# Patient Record
Sex: Male | Born: 1945 | Race: White | Hispanic: No | Marital: Married | Smoking: Former smoker
Health system: Southern US, Community
[De-identification: ages and names within clinical notes are randomized; demographics above are authoritative.]

## PROBLEM LIST (undated history)

## (undated) DIAGNOSIS — F419 Anxiety disorder, unspecified: Secondary | ICD-10-CM

## (undated) DIAGNOSIS — G4730 Sleep apnea, unspecified: Secondary | ICD-10-CM

## (undated) DIAGNOSIS — I10 Essential (primary) hypertension: Secondary | ICD-10-CM

## (undated) DIAGNOSIS — E785 Hyperlipidemia, unspecified: Secondary | ICD-10-CM

## (undated) DIAGNOSIS — Z8719 Personal history of other diseases of the digestive system: Secondary | ICD-10-CM

## (undated) DIAGNOSIS — K219 Gastro-esophageal reflux disease without esophagitis: Secondary | ICD-10-CM

## (undated) DIAGNOSIS — E119 Type 2 diabetes mellitus without complications: Secondary | ICD-10-CM

## (undated) DIAGNOSIS — F329 Major depressive disorder, single episode, unspecified: Secondary | ICD-10-CM

## (undated) DIAGNOSIS — F32A Depression, unspecified: Secondary | ICD-10-CM

## (undated) DIAGNOSIS — M1990 Unspecified osteoarthritis, unspecified site: Secondary | ICD-10-CM

## (undated) HISTORY — PX: VASECTOMY: SHX75

## (undated) HISTORY — PX: TONSILLECTOMY: SUR1361

## (undated) HISTORY — PX: EYE SURGERY: SHX253

---

## 2013-05-15 ENCOUNTER — Other Ambulatory Visit: Payer: Self-pay | Admitting: Orthopedic Surgery

## 2013-05-15 NOTE — H&P (Signed)
Darryl Chang DOB: [birth year] Married / Language: English / Race: White Male  Date of Admission:  06-11-2013  Chief Complaint:  Bilateral Knee Pain  History of Present Illness The patient is a 68 year old male who comes in  for a preoperative History and Physical. The patient is scheduled for a bilateral total knee arthroplasty to be performed by Dr. Gus RankinFrank V. [name], MD at [facility] on 06-11-2013. The patient is a 68 year old male who presents with knee complaints. The patient is seen in referral from Dr Amanda PeaGramig. The patient reports left knee and right knee symptoms including: pain which began year(s) ago without any known injury. [facility]. He has had cortisone and visco injections in the past. He reports that the cortisone injections helped for several months at a time but his last injection was most likely 2 years ago. He did not have any relief with the visco. He has never had surgery on the knees. His left knee is bothering him more than the right knee. He is getting to the point where his activity is severely limited. He is semi-retired now. 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 He feels that he needs to have the knees replaced but is concerned that if he does he will not longer be able to do the work that he currently does. He states that both knees are hurting badly. 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 He said that he is doing more of a supervisory role now than actually getting down on his knees to do things. He said functionally the knees have gotten much worse. He is at a stage now where he feels as though is knees need to be fixed. Both knees are bothering him equally. He has had cortisone and viscosupplement injections without benefit. He is ready to proceed with bilateral knee replacements. They have been treated conservatively in the past for the above  stated problem and despite conservative measures, they continue to have progressive pain and severe functional limitations and dysfunction. They have failed non-operative management including home exercise, medications, and injections. It is felt that they would benefit from undergoing total joint replacement. Risks and benefits of the procedure have been discussed with the patient and they elect to proceed with surgery. There are no active contraindications to surgery such as ongoing infection or rapidly progressive neurological disease.   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 Mother.    Social History Marital status. married Never consumed alcohol. 02/21/2013: Never consumed alcohol Living situation. live with spouse Current work status. retired Exercise. Exercises rarely No history of drug/alcohol rehab Tobacco / smoke exposure. 02/21/2013: no Not under pain contract Number of flights of stairs before winded. less than 1 Children. 3 Tobacco use. Former smoker. 02/21/2013: smoke(d) 1 pack(s) per day Post-Surgical Plans. [facility] Advance Directives. Living Will, Healthcare POA    Medication History Anafranil (50MG  Capsule, Oral) Active. Aspirin EC (81MG  Tablet DR, Oral) Active. GlipiZIDE (5MG  Tablet, Oral) Active. Lisinopril (20MG  Tablet, Oral) Active. MetFORMIN HCl (1000MG  Tablet, Oral) Active. Naproxen (500MG  Tablet, Oral) Active. Omeprazole (20MG  Capsule DR, Oral) Active. PROzac (20MG   Capsule, Oral) Active. Simvastatin (20MG  Tablet, Oral) Active.    Past Surgical History Tonsillectomy Vasectomy Cataract  Surgery. bilateral   Review of Systems\ General:Not Present- Chills, Fever, Night Sweats, Fatigue, Weight Gain, Weight Loss and Memory Loss. Skin:Not Present- Hives, Itching, Rash, Eczema and Lesions. HEENT:Not Present- Tinnitus, Headache, Double Vision, Visual Loss, Hearing Loss and Dentures. Respiratory:Not Present- Shortness of breath with exertion, Shortness of breath at rest, Allergies, Coughing up blood and Chronic Cough. Cardiovascular:Not Present- Chest Pain, Racing/skipping heartbeats, Difficulty Breathing Lying Down, Murmur, Swelling and Palpitations. Gastrointestinal:Not Present- Bloody Stool, Heartburn, Abdominal Pain, Vomiting, Nausea, Constipation, Diarrhea, Difficulty Swallowing, Jaundice and Loss of appetitie. Male Genitourinary:Not Present- Urinary frequency, Blood in Urine, Weak urinary stream, Discharge, Flank Pain, Incontinence, Painful Urination, Urgency, Urinary Retention and Urinating at Night. Musculoskeletal:Not Present- Muscle Weakness, Muscle Pain, Joint Swelling, Joint Pain, Back Pain, Morning Stiffness and Spasms. Neurological:Not Present- Tremor, Dizziness, Blackout spells, Paralysis, Difficulty with balance and Weakness. Psychiatric:Not Present- Insomnia.    Vitals Pulse: 68 (Regular) Resp.: 14 (Unlabored) BP: 124/78 (Sitting, Right Arm, Standard)     Physical Exam The physical exam findings are as follows: The patient is a 68 year old male with continued bilateral knee pain. He is accompanied by his wife Diane.   General Mental Status - Alert, cooperative and good historian. General Appearance- pleasant. Not in acute distress. Orientation- Oriented X3. Build & Nutrition- Well nourished and Well developed.   Head and Neck Head- normocephalic, atraumatic . Neck Global Assessment- supple. no bruit auscultated on the right and no bruit auscultated on the left.   Eye Pupil- Bilateral- Regular and Round. Motion-  Bilateral- EOMI. wears glasses ENMT full upper and partial lower dentures  Chest and Lung Exam Auscultation: Breath sounds:- clear at anterior chest wall and - clear at posterior chest wall. Adventitious sounds:- No Adventitious sounds.   Cardiovascular Auscultation:Rhythm- Regular rate and rhythm. Heart Sounds- S1 WNL and S2 WNL. Murmurs & Other Heart Sounds:Auscultation of the heart reveals - No Murmurs.   Abdomen Inspection:Contour- Generalized mild distention. Palpation/Percussion:Tenderness- Abdomen is non-tender to palpation. Rigidity (guarding)- Abdomen is soft. Auscultation:Auscultation of the abdomen reveals - Bowel sounds normal.   Male Genitourinary  Not done, not pertinent to present illness  Musculoskeletal On exam, very pleasant, well developed male alert and oriented in no apparent distress. Both hips show normal range of motion with no discomfort. Both knees show no effusion. He has varus deformity on both knees. His range of motion is about 5-125 on each side with marked crepitus on range of motion. He has tenderness medial greater than lateral with no instability noted.  RADIOGRAPHS: AP both knees and lateral show severe bone on bone arthritis medial and patellofemoral compartments of both knees with significant varus deformity in both.   Assessment & Plan Osteoarthritis of both knees (715.26)  Note: Plan is for a Bilateral Total Knee Replacements by Dr. Lequita Halt.  Plan is to go to inpatient rehab facility. [facility] [address] [place], [place]  [phone] The patient will bring further contact information to the hospital.   The rehab facility is in [place] as the RX's will need to be signed by Dr. Lequita Halt prior to his discharge from the hospital  PCP - Dr. Carvel Getting  The patient does not have any contraindications and will receive TXA (tranexamic acid) prior to surgery.  Signed electronically by  Lauraine Rinne, III PA-C

## 2013-05-28 ENCOUNTER — Encounter (HOSPITAL_COMMUNITY): Payer: Self-pay | Admitting: Pharmacy Technician

## 2013-06-03 ENCOUNTER — Encounter (HOSPITAL_COMMUNITY)
Admission: RE | Admit: 2013-06-03 | Discharge: 2013-06-03 | Disposition: A | Discharge status: Home / Self Care | Payer: Medicare Other | Source: Ambulatory Visit | Attending: Orthopedic Surgery | Admitting: Orthopedic Surgery

## 2013-06-03 ENCOUNTER — Encounter (HOSPITAL_COMMUNITY): Payer: Self-pay

## 2013-06-03 ENCOUNTER — Encounter (INDEPENDENT_AMBULATORY_CARE_PROVIDER_SITE_OTHER): Payer: Self-pay

## 2013-06-03 DIAGNOSIS — Z01812 Encounter for preprocedural laboratory examination: Secondary | ICD-10-CM | POA: Insufficient documentation

## 2013-06-03 HISTORY — DX: Type 2 diabetes mellitus without complications: E11.9

## 2013-06-03 HISTORY — DX: Unspecified osteoarthritis, unspecified site: M19.90

## 2013-06-03 HISTORY — DX: Hyperlipidemia, unspecified: E78.5

## 2013-06-03 HISTORY — DX: Personal history of other diseases of the digestive system: Z87.19

## 2013-06-03 HISTORY — DX: Anxiety disorder, unspecified: F41.9

## 2013-06-03 HISTORY — DX: Major depressive disorder, single episode, unspecified: F32.9

## 2013-06-03 HISTORY — DX: Sleep apnea, unspecified: G47.30

## 2013-06-03 HISTORY — DX: Gastro-esophageal reflux disease without esophagitis: K21.9

## 2013-06-03 HISTORY — DX: Depression, unspecified: F32.A

## 2013-06-03 HISTORY — DX: Essential (primary) hypertension: I10

## 2013-06-03 LAB — SURGICAL PCR SCREEN
MRSA, PCR: NEGATIVE
Staphylococcus aureus: NEGATIVE

## 2013-06-03 LAB — PROTIME-INR
INR: 0.98 (ref 0.00–1.49)
Prothrombin Time: 12.8 seconds (ref 11.6–15.2)

## 2013-06-03 LAB — APTT: aPTT: 30 seconds (ref 24–37)

## 2013-06-03 LAB — ABO/RH: ABO/RH(D): O POS

## 2013-06-03 NOTE — Patient Instructions (Signed)
   Y[facility] SURGERY IS SCHEDULED AT [facility]  ON:  Wednesday  4/8  REPORT TO  SHORT STAY CENTER AT:  11:00 AM      PHONE # FOR SHORT STAY IS [phone]  DO NOT EAT ANYTHING AFTER MIDNIGHT THE NIGHT BEFORE YOUR SURGERY.  YOU MAY BRUSH YOUR TEETH.   NO FOOD, NO CHEWING GUM, NO MINTS, NO CANDIES, NO CHEWING TOBACCO. YOU MAY HAVE CLEAR LIQUIDS TO DRINK FROM MIDNIGHT UNTIL 7:30 AM DAY OF YOUR SURGERY - LIKE WATER, TEA.      NOTHING TO DRINK AFTER 7:30 AM DAY OF SURGERY   PLEASE TAKE THE FOLLOWING MEDICATIONS THE AM OF YOUR SURGERY WITH A FEW SIPS OF WATER:  FLUOXETINE, OMEPRAZOLE.   IF YOU ARE DIABETIC:  DO NOT TAKE ANY DIABETIC MEDICATIONS THE AM OF YOUR SURGERY.  IF YOU HAVE SLEEP APNEA AND USE CPAP OR BIPAP--PLEASE BRING THE MASK AND THE TUBING.  DO NOT BRING YOUR MACHINE.  DO NOT BRING VALUABLES, MONEY, CREDIT CARDS.  DO NOT WEAR JEWELRY, MAKE-UP, NAIL POLISH AND NO METAL PINS OR CLIPS IN YOUR HAIR. CONTACT LENS, DENTURES / PARTIALS, GLASSES SHOULD NOT BE WORN TO SURGERY AND IN MOST CASES-HEARING AIDS WILL NEED TO BE REMOVED.  BRING YOUR GLASSES CASE, ANY EQUIPMENT NEEDED FOR YOUR CONTACT LENS. FOR PATIENTS ADMITTED TO THE HOSPITAL--CHECK OUT TIME THE DAY OF DISCHARGE IS 11:00 AM.  ALL INPATIENT ROOMS ARE PRIVATE - WITH BATHROOM, TELEPHONE, TELEVISION AND WIFI INTERNET.                                                    PLEASE READ OVER ANY  FACT SHEETS THAT YOU WERE GIVEN: MRSA INFORMATION, BLOOD TRANSFUSION INFORMATION, INCENTIVE SPIROMETER INFORMATION.  FAILURE TO FOLLOW THESE INSTRUCTIONS MAY RESULT IN THE CANCELLATION OF YOUR SURGERY. PLEASE BE AWARE THAT YOU MAY NEED ADDITIONAL BLOOD DRAWN DAY OF YOUR SURGERY  PATIENT SIGNATURE_________________________________

## 2013-06-03 NOTE — Pre-Procedure Instructions (Signed)
PT'S MEDICAL DOCTOR - DR. SETTLE - SENT NOTE OF MEDICAL CLEARANCE FOR SURGERY AND PT'S EKG, CXR, CBC, CMET AND URINALYSIS REPORTS FROM 05/29/13. PT, PTT, T/S WERE DONE TODAY - [facility] AT [facility].

## 2013-06-05 ENCOUNTER — Other Ambulatory Visit: Payer: Self-pay | Admitting: Orthopedic Surgery

## 2013-06-05 NOTE — H&P (Signed)
Darryl Chang DOB: 07/23/1945 Married / Language: English / Race: White Male  Date of Admission:  06-11-2013  Chief Complaint:  Bilateral Knee Pain  History of Present Illness The patient is a 68 year old male who comes in for a preoperative History and Physical. The patient is scheduled for a bilateral total knee arthroplasty to be performed by Dr. [name]. [name], MD at [facility] on 06-11-2013. The patient is a 68 year old male who presents with knee complaints. The patient is seen in referral from Dr [name]. The patient reports left knee and right knee symptoms including: pain which began year(s) ago without any known injury. He has been [facility] by [facility]. He has had cortisone and visco injections in the past. He reports that the cortisone injections helped for several months at a time but his last injection was most likely 2 years ago. He did not have any relief with the visco. He has never had surgery on the knees. His left knee is bothering him more than the right knee. He is getting to the point where his activity is severely limited. 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 He feels that he needs to have the knees replaced but is concerned that if he does he will not longer be able to do the work that he currently does. He states that both knees are hurting badly. 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 He said that he is doing more of a supervisory role now than actually getting down on his knees to do things. He said functionally the knees have gotten much worse. He is at a stage now where he feels as though is knees need to be fixed. Both knees are bothering him equally. He has had cortisone and viscosupplement injections without benefit. He is ready to proceed with bilateral knee replacements. They have been treated conservatively in the past for the above stated  problem and despite conservative measures, they continue to have progressive pain and severe functional limitations and dysfunction. They have failed non-operative management including home exercise, medications, and injections. It is felt that they would benefit from undergoing total joint replacement. Risks and benefits of the procedure have been discussed with the patient and they elect to proceed with surgery. There are no active contraindications to surgery such as ongoing infection or rapidly progressive neurological disease.   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 Mother.    Social History Marital status. married Never consumed alcohol. 02/21/2013: Never consumed alcohol Living situation. live with spouse Current work status. retired Exercise. Exercises rarely No history of drug/alcohol rehab Tobacco / smoke exposure. 02/21/2013: no Not under pain contract Number of flights of stairs before winded. less than 1 Children. 3 Tobacco use. Former smoker. 02/21/2013: smoke(d) 1 pack(s) per day Post-Surgical Plans. Inpatient Rehab - [facility] Advance Directives. Living Will, Healthcare POA    Medication History Anafranil (50MG  Capsule, Oral) Active. Aspirin EC (81MG  Tablet DR, Oral) Active. GlipiZIDE (5MG  Tablet, Oral) Active. Lisinopril (20MG  Tablet, Oral) Active. MetFORMIN HCl (1000MG  Tablet, Oral) Active. Naproxen (500MG  Tablet, Oral) Active. Omeprazole (20MG  Capsule DR, Oral) Active. PROzac (20MG  Capsule,  Oral) Active. Simvastatin (20MG  Tablet, Oral) Active.    Past Surgical History Tonsillectomy Vasectomy Cataract Surgery.  bilateral    Review of Systems General:Not Present- Chills, Fever, Night Sweats, Fatigue, Weight Gain, Weight Loss and Memory Loss. Skin:Not Present- Hives, Itching, Rash, Eczema and Lesions. HEENT:Not Present- Tinnitus, Headache, Double Vision, Visual Loss, Hearing Loss and Dentures. Respiratory:Not Present- Shortness of breath with exertion, Shortness of breath at rest, Allergies, Coughing up blood and Chronic Cough. Cardiovascular:Not Present- Chest Pain, Racing/skipping heartbeats, Difficulty Breathing Lying Down, Murmur, Swelling and Palpitations. Gastrointestinal:Not Present- Bloody Stool, Heartburn, Abdominal Pain, Vomiting, Nausea, Constipation, Diarrhea, Difficulty Swallowing, Jaundice and Loss of appetitie. Male Genitourinary:Not Present- Urinary frequency, Blood in Urine, Weak urinary stream, Discharge, Flank Pain, Incontinence, Painful Urination, Urgency, Urinary Retention and Urinating at Night. Musculoskeletal:Not Present- Muscle Weakness, Muscle Pain, Joint Swelling, Joint Pain, Back Pain, Morning Stiffness and Spasms. Neurological:Not Present- Tremor, Dizziness, Blackout spells, Paralysis, Difficulty with balance and Weakness. Psychiatric:Not Present- Insomnia.    Vitals Pulse: 68 (Regular) Resp.: 14 (Unlabored) BP: 124/78 (Sitting, Right Arm, Standard)     Physical Exam([name], III PA-C; 05/15/2013 11:33 AM) The physical exam findings are as follows:   General Mental Status - Alert, cooperative and good historian. General Appearance- pleasant. Not in acute distress. Orientation- Oriented X3. Build & Nutrition- Well nourished and Well developed.   Head and Neck Head- normocephalic, atraumatic . Neck Global Assessment- supple. no bruit auscultated on the right and no bruit auscultated on the left.   Eye Pupil- Bilateral- Regular and Round. Motion- Bilateral- EOMI. wears glasses  ENMT : full upper and  partial lower dentures  Chest and Lung Exam Auscultation: Breath sounds:- clear at anterior chest wall and - clear at posterior chest wall. Adventitious sounds:- No Adventitious sounds.   Cardiovascular Auscultation:Rhythm- Regular rate and rhythm. Heart Sounds- S1 WNL and S2 WNL. Murmurs & Other Heart Sounds:Auscultation of the heart reveals - No Murmurs.   Abdomen Inspection:Contour- Generalized mild distention. Palpation/Percussion:Tenderness- Abdomen is non-tender to palpation. Rigidity (guarding)- Abdomen is soft. Auscultation:Auscultation of the abdomen reveals - Bowel sounds normal.   Male Genitourinary Not done, not pertinent to present illness  Musculoskeletal  On exam, very pleasant, well developed male alert and oriented in no apparent distress. Both hips show normal range of motion with no discomfort. Both knees show no effusion. He has varus deformity on both knees. His range of motion is about 5-125 on each side with marked crepitus on range of motion. He has tenderness medial greater than lateral with no instability noted.  RADIOGRAPHS: AP both knees and lateral show severe bone on bone arthritis medial and patellofemoral compartments of both knees with significant varus deformity in both.   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 Lequita HaltAluisio.  Plan is to go to inpatient rehab facility.  PCP - Dr. Carvel GettingPaul Settlle  The patient does not have any contraindications and will receive TXA (tranexamic acid) prior to surgery.  Signed electronically by Lauraine RinneAlexzandrew L Perkins, III PA-C

## 2013-06-11 ENCOUNTER — Inpatient Hospital Stay (HOSPITAL_COMMUNITY): Payer: Medicare Other | Admitting: Anesthesiology

## 2013-06-11 ENCOUNTER — Encounter (HOSPITAL_COMMUNITY): Payer: Self-pay | Admitting: *Deleted

## 2013-06-11 ENCOUNTER — Inpatient Hospital Stay (HOSPITAL_COMMUNITY)
Admission: RE | Admit: 2013-06-11 | Discharge: 2013-06-16 | DRG: 462 | Disposition: A | Discharge status: Rehabilitation Facility | Payer: Medicare Other | Source: Ambulatory Visit | Attending: Orthopedic Surgery | Admitting: Orthopedic Surgery

## 2013-06-11 ENCOUNTER — Encounter (HOSPITAL_COMMUNITY): Payer: Medicare Other | Admitting: Anesthesiology

## 2013-06-11 ENCOUNTER — Encounter (HOSPITAL_COMMUNITY)
Admission: RE | Disposition: A | Discharge status: Rehabilitation Facility | Payer: Self-pay | Source: Ambulatory Visit | Attending: Orthopedic Surgery

## 2013-06-11 DIAGNOSIS — K219 Gastro-esophageal reflux disease without esophagitis: Secondary | ICD-10-CM | POA: Diagnosis present

## 2013-06-11 DIAGNOSIS — G4730 Sleep apnea, unspecified: Secondary | ICD-10-CM | POA: Diagnosis present

## 2013-06-11 DIAGNOSIS — F329 Major depressive disorder, single episode, unspecified: Secondary | ICD-10-CM | POA: Diagnosis present

## 2013-06-11 DIAGNOSIS — Z79899 Other long term (current) drug therapy: Secondary | ICD-10-CM

## 2013-06-11 DIAGNOSIS — M1710 Unilateral primary osteoarthritis, unspecified knee: Principal | ICD-10-CM | POA: Diagnosis present

## 2013-06-11 DIAGNOSIS — D62 Acute posthemorrhagic anemia: Secondary | ICD-10-CM | POA: Diagnosis not present

## 2013-06-11 DIAGNOSIS — Z6837 Body mass index (BMI) 37.0-37.9, adult: Secondary | ICD-10-CM

## 2013-06-11 DIAGNOSIS — K3189 Other diseases of stomach and duodenum: Secondary | ICD-10-CM | POA: Diagnosis not present

## 2013-06-11 DIAGNOSIS — I10 Essential (primary) hypertension: Secondary | ICD-10-CM | POA: Diagnosis present

## 2013-06-11 DIAGNOSIS — M179 Osteoarthritis of knee, unspecified: Secondary | ICD-10-CM

## 2013-06-11 DIAGNOSIS — E785 Hyperlipidemia, unspecified: Secondary | ICD-10-CM | POA: Diagnosis present

## 2013-06-11 DIAGNOSIS — R1013 Epigastric pain: Secondary | ICD-10-CM

## 2013-06-11 DIAGNOSIS — Z87891 Personal history of nicotine dependence: Secondary | ICD-10-CM

## 2013-06-11 DIAGNOSIS — K5900 Constipation, unspecified: Secondary | ICD-10-CM | POA: Diagnosis not present

## 2013-06-11 DIAGNOSIS — F3289 Other specified depressive episodes: Secondary | ICD-10-CM | POA: Diagnosis present

## 2013-06-11 DIAGNOSIS — F411 Generalized anxiety disorder: Secondary | ICD-10-CM | POA: Diagnosis present

## 2013-06-11 HISTORY — PX: TOTAL KNEE ARTHROPLASTY: SHX125

## 2013-06-11 LAB — URINALYSIS, ROUTINE W REFLEX MICROSCOPIC
BILIRUBIN URINE: NEGATIVE
Glucose, UA: NEGATIVE mg/dL
HGB URINE DIPSTICK: NEGATIVE
Ketones, ur: NEGATIVE mg/dL
Leukocytes, UA: NEGATIVE
Nitrite: NEGATIVE
PROTEIN: NEGATIVE mg/dL
Specific Gravity, Urine: 1.027 (ref 1.005–1.030)
UROBILINOGEN UA: 0.2 mg/dL (ref 0.0–1.0)
pH: 7 (ref 5.0–8.0)

## 2013-06-11 LAB — CBC
HCT: 35.8 % — ABNORMAL LOW (ref 39.0–52.0)
Hemoglobin: 12.1 g/dL — ABNORMAL LOW (ref 13.0–17.0)
MCH: 27.1 pg (ref 26.0–34.0)
MCHC: 33.8 g/dL (ref 30.0–36.0)
MCV: 80.1 fL (ref 78.0–100.0)
PLATELETS: 209 10*3/uL (ref 150–400)
RBC: 4.47 MIL/uL (ref 4.22–5.81)
RDW: 13.3 % (ref 11.5–15.5)
WBC: 7.7 10*3/uL (ref 4.0–10.5)

## 2013-06-11 LAB — COMPREHENSIVE METABOLIC PANEL
ALT: 26 U/L (ref 0–53)
AST: 23 U/L (ref 0–37)
Albumin: 4.2 g/dL (ref 3.5–5.2)
Alkaline Phosphatase: 44 U/L (ref 39–117)
BUN: 25 mg/dL — ABNORMAL HIGH (ref 6–23)
CHLORIDE: 101 meq/L (ref 96–112)
CO2: 28 meq/L (ref 19–32)
CREATININE: 1.04 mg/dL (ref 0.50–1.35)
Calcium: 9.6 mg/dL (ref 8.4–10.5)
GFR calc Af Amer: 83 mL/min — ABNORMAL LOW (ref >90)
GFR, EST NON AFRICAN AMERICAN: 71 mL/min — AB (ref >90)
Glucose, Bld: 127 mg/dL — ABNORMAL HIGH (ref 70–99)
Potassium: 4.5 mEq/L (ref 3.7–5.3)
SODIUM: 141 meq/L (ref 137–147)
Total Bilirubin: 0.3 mg/dL (ref 0.3–1.2)
Total Protein: 6.9 g/dL (ref 6.0–8.3)

## 2013-06-11 LAB — GLUCOSE, CAPILLARY
GLUCOSE-CAPILLARY: 160 mg/dL — AB (ref 70–99)
Glucose-Capillary: 117 mg/dL — ABNORMAL HIGH (ref 70–99)
Glucose-Capillary: 150 mg/dL — ABNORMAL HIGH (ref 70–99)
Glucose-Capillary: 229 mg/dL — ABNORMAL HIGH (ref 70–99)

## 2013-06-11 SURGERY — ARTHROPLASTY, KNEE, BILATERAL, TOTAL
Anesthesia: Epidural | Site: Knee | Laterality: Bilateral

## 2013-06-11 MED ORDER — ACETAMINOPHEN 325 MG PO TABS
650.0000 mg | ORAL_TABLET | Freq: Four times a day (QID) | ORAL | Status: DC | PRN
  Administered 2013-06-16 (×2): 650 mg via ORAL
  Filled 2013-06-11 (×2): qty 2

## 2013-06-11 MED ORDER — ACETAMINOPHEN 500 MG PO TABS
1000.0000 mg | ORAL_TABLET | Freq: Four times a day (QID) | ORAL | Status: AC
  Administered 2013-06-11 – 2013-06-12 (×4): 1000 mg via ORAL
  Filled 2013-06-11 (×5): qty 2

## 2013-06-11 MED ORDER — NALBUPHINE HCL 20 MG/ML IJ SOLN
5.0000 mg | INTRAMUSCULAR | Status: DC | PRN
  Filled 2013-06-11: qty 1

## 2013-06-11 MED ORDER — PHENOL 1.4 % MT LIQD: 1.0000 | OROMUCOSAL | Status: DC | PRN

## 2013-06-11 MED ORDER — FLEET ENEMA 7-19 GM/118ML RE ENEM: 1.0000 | ENEMA | Freq: Once | RECTAL | Status: AC | PRN

## 2013-06-11 MED ORDER — ONDANSETRON HCL 4 MG/2ML IJ SOLN
4.0000 mg | Freq: Four times a day (QID) | INTRAMUSCULAR | Status: DC | PRN
  Administered 2013-06-13: 4 mg via INTRAVENOUS
  Filled 2013-06-11: qty 2

## 2013-06-11 MED ORDER — CEFAZOLIN SODIUM-DEXTROSE 2-3 GM-% IV SOLR
2.0000 g | INTRAVENOUS | Status: AC
  Administered 2013-06-11: 2 g via INTRAVENOUS

## 2013-06-11 MED ORDER — INSULIN ASPART 100 UNIT/ML ~~LOC~~ SOLN: 0.0000 [IU] | Freq: Three times a day (TID) | SUBCUTANEOUS | Status: DC

## 2013-06-11 MED ORDER — SODIUM CHLORIDE 0.9 % IJ SOLN
INTRAMUSCULAR | Status: AC
  Filled 2013-06-11: qty 50

## 2013-06-11 MED ORDER — ONDANSETRON HCL 4 MG PO TABS
4.0000 mg | ORAL_TABLET | Freq: Four times a day (QID) | ORAL | Status: DC | PRN
  Administered 2013-06-15: 4 mg via ORAL
  Filled 2013-06-11: qty 1

## 2013-06-11 MED ORDER — TRANEXAMIC ACID 100 MG/ML IV SOLN
1000.0000 mg | INTRAVENOUS | Status: AC
  Administered 2013-06-11: 1000 mg via INTRAVENOUS
  Filled 2013-06-11: qty 10

## 2013-06-11 MED ORDER — SIMVASTATIN 20 MG PO TABS
20.0000 mg | ORAL_TABLET | Freq: Every day | ORAL | Status: DC
  Administered 2013-06-11 – 2013-06-15 (×5): 20 mg via ORAL
  Filled 2013-06-11 (×6): qty 1

## 2013-06-11 MED ORDER — BISACODYL 10 MG RE SUPP
10.0000 mg | Freq: Every day | RECTAL | Status: DC | PRN
  Administered 2013-06-15: 10 mg via RECTAL
  Filled 2013-06-11: qty 1

## 2013-06-11 MED ORDER — BUPIVACAINE HCL (PF) 0.25 % IJ SOLN
INTRAMUSCULAR | Status: AC
  Filled 2013-06-11: qty 30

## 2013-06-11 MED ORDER — INSULIN ASPART 100 UNIT/ML ~~LOC~~ SOLN
0.0000 [IU] | Freq: Three times a day (TID) | SUBCUTANEOUS | Status: DC
Start: 2013-06-11 — End: 2013-06-16
  Administered 2013-06-11 – 2013-06-12 (×3): 3 [IU] via SUBCUTANEOUS
  Administered 2013-06-12 – 2013-06-13 (×2): 2 [IU] via SUBCUTANEOUS
  Administered 2013-06-13 – 2013-06-14 (×3): 3 [IU] via SUBCUTANEOUS
  Administered 2013-06-14 – 2013-06-16 (×5): 2 [IU] via SUBCUTANEOUS

## 2013-06-11 MED ORDER — PROPOFOL 10 MG/ML IV BOLUS
INTRAVENOUS | Status: AC
  Filled 2013-06-11: qty 20

## 2013-06-11 MED ORDER — LACTATED RINGERS IV SOLN
INTRAVENOUS | Status: DC
  Administered 2013-06-11 (×2): via INTRAVENOUS
  Administered 2013-06-11: 1000 mL via INTRAVENOUS

## 2013-06-11 MED ORDER — MIDAZOLAM HCL 2 MG/2ML IJ SOLN
INTRAMUSCULAR | Status: AC
  Filled 2013-06-11: qty 2

## 2013-06-11 MED ORDER — DOCUSATE SODIUM 100 MG PO CAPS
100.0000 mg | ORAL_CAPSULE | Freq: Two times a day (BID) | ORAL | Status: DC
  Administered 2013-06-11 – 2013-06-15 (×8): 100 mg via ORAL

## 2013-06-11 MED ORDER — OXYCODONE HCL 5 MG PO TABS: 5.0000 mg | ORAL_TABLET | ORAL | Status: DC | PRN

## 2013-06-11 MED ORDER — PROPOFOL INFUSION 10 MG/ML OPTIME
INTRAVENOUS | Status: DC | PRN
  Administered 2013-06-11: 70 ug/kg/min via INTRAVENOUS

## 2013-06-11 MED ORDER — METFORMIN HCL 500 MG PO TABS
1000.0000 mg | ORAL_TABLET | Freq: Two times a day (BID) | ORAL | Status: DC
  Administered 2013-06-12 – 2013-06-16 (×9): 1000 mg via ORAL
  Filled 2013-06-11 (×11): qty 2

## 2013-06-11 MED ORDER — CEFAZOLIN SODIUM-DEXTROSE 2-3 GM-% IV SOLR
2.0000 g | Freq: Four times a day (QID) | INTRAVENOUS | Status: AC
  Administered 2013-06-11 – 2013-06-12 (×2): 2 g via INTRAVENOUS
  Filled 2013-06-11 (×2): qty 50

## 2013-06-11 MED ORDER — FLUOXETINE HCL 20 MG PO TABS
20.0000 mg | ORAL_TABLET | Freq: Two times a day (BID) | ORAL | Status: DC
  Administered 2013-06-11 – 2013-06-16 (×10): 20 mg via ORAL
  Filled 2013-06-11 (×12): qty 1

## 2013-06-11 MED ORDER — CHLORHEXIDINE GLUCONATE 4 % EX LIQD: 60.0000 mL | Freq: Once | CUTANEOUS | Status: DC

## 2013-06-11 MED ORDER — FENTANYL CITRATE 0.05 MG/ML IJ SOLN
INTRAMUSCULAR | Status: AC
Start: 2013-06-11 — End: 2013-06-11
  Filled 2013-06-11: qty 2

## 2013-06-11 MED ORDER — PROMETHAZINE HCL 25 MG/ML IJ SOLN
6.2500 mg | INTRAMUSCULAR | Status: DC | PRN
Start: 2013-06-11 — End: 2013-06-11

## 2013-06-11 MED ORDER — BUPIVACAINE HCL (PF) 0.75 % IJ SOLN
INTRAMUSCULAR | Status: DC
  Administered 2013-06-11: 16:00:00 via EPIDURAL
  Filled 2013-06-11 (×5): qty 25

## 2013-06-11 MED ORDER — BUPIVACAINE IN DEXTROSE 0.75-8.25 % IT SOLN
INTRATHECAL | Status: DC | PRN
  Administered 2013-06-11: 2 mL via INTRATHECAL

## 2013-06-11 MED ORDER — SODIUM CHLORIDE 0.9 % IV SOLN: 15.0000 mL/h | INTRAVENOUS | Status: DC

## 2013-06-11 MED ORDER — NALOXONE HCL 1 MG/ML IJ SOLN
1.0000 ug/kg/h | INTRAVENOUS | Status: DC | PRN
  Filled 2013-06-11: qty 2

## 2013-06-11 MED ORDER — SODIUM CHLORIDE 0.9 % IV SOLN
INTRAVENOUS | Status: DC
  Administered 2013-06-11 – 2013-06-13 (×3): via INTRAVENOUS

## 2013-06-11 MED ORDER — MORPHINE SULFATE 2 MG/ML IJ SOLN
1.0000 mg | INTRAMUSCULAR | Status: DC | PRN
  Administered 2013-06-13 (×2): 2 mg via INTRAVENOUS
  Filled 2013-06-11 (×4): qty 1

## 2013-06-11 MED ORDER — OXYCODONE HCL 5 MG PO TABS
5.0000 mg | ORAL_TABLET | ORAL | Status: DC | PRN
  Administered 2013-06-13: 5 mg via ORAL
  Administered 2013-06-13: 10 mg via ORAL
  Administered 2013-06-13: 20 mg via ORAL
  Administered 2013-06-13: 15 mg via ORAL
  Administered 2013-06-14 (×7): 20 mg via ORAL
  Administered 2013-06-15: 15 mg via ORAL
  Administered 2013-06-15: 20 mg via ORAL
  Administered 2013-06-15: 15 mg via ORAL
  Administered 2013-06-15 – 2013-06-16 (×3): 20 mg via ORAL
  Filled 2013-06-11: qty 3
  Filled 2013-06-11: qty 4
  Filled 2013-06-11: qty 1
  Filled 2013-06-11 (×2): qty 4
  Filled 2013-06-11: qty 3
  Filled 2013-06-11 (×7): qty 4
  Filled 2013-06-11: qty 3
  Filled 2013-06-11: qty 4
  Filled 2013-06-11: qty 2
  Filled 2013-06-11: qty 4

## 2013-06-11 MED ORDER — MIDAZOLAM HCL 5 MG/5ML IJ SOLN
INTRAMUSCULAR | Status: DC | PRN
  Administered 2013-06-11: 2 mg via INTRAVENOUS

## 2013-06-11 MED ORDER — HYDROMORPHONE HCL PF 1 MG/ML IJ SOLN: 0.2500 mg | INTRAMUSCULAR | Status: DC | PRN

## 2013-06-11 MED ORDER — CEFAZOLIN SODIUM-DEXTROSE 2-3 GM-% IV SOLR
INTRAVENOUS | Status: AC
  Filled 2013-06-11: qty 50

## 2013-06-11 MED ORDER — FENTANYL CITRATE 0.05 MG/ML IJ SOLN
INTRAMUSCULAR | Status: DC | PRN
  Administered 2013-06-11: 100 ug via INTRAVENOUS

## 2013-06-11 MED ORDER — METOCLOPRAMIDE HCL 10 MG PO TABS: 5.0000 mg | ORAL_TABLET | Freq: Three times a day (TID) | ORAL | Status: DC | PRN

## 2013-06-11 MED ORDER — METHOCARBAMOL 500 MG PO TABS: 500.0000 mg | ORAL_TABLET | Freq: Four times a day (QID) | ORAL | Status: DC | PRN

## 2013-06-11 MED ORDER — INSULIN ASPART 100 UNIT/ML ~~LOC~~ SOLN
0.0000 [IU] | Freq: Every day | SUBCUTANEOUS | Status: DC
  Administered 2013-06-11 – 2013-06-12 (×2): 2 [IU] via SUBCUTANEOUS

## 2013-06-11 MED ORDER — DEXAMETHASONE 6 MG PO TABS
10.0000 mg | ORAL_TABLET | Freq: Every day | ORAL | Status: AC
  Administered 2013-06-12: 10 mg via ORAL
  Filled 2013-06-11: qty 1

## 2013-06-11 MED ORDER — METHOCARBAMOL 100 MG/ML IJ SOLN
500.0000 mg | Freq: Four times a day (QID) | INTRAVENOUS | Status: DC | PRN
  Administered 2013-06-11: 500 mg via INTRAVENOUS
  Filled 2013-06-11: qty 5

## 2013-06-11 MED ORDER — ACETAMINOPHEN 650 MG RE SUPP: 650.0000 mg | Freq: Four times a day (QID) | RECTAL | Status: DC | PRN

## 2013-06-11 MED ORDER — POLYETHYLENE GLYCOL 3350 17 G PO PACK: 17.0000 g | PACK | Freq: Every day | ORAL | Status: DC | PRN

## 2013-06-11 MED ORDER — ACETAMINOPHEN 10 MG/ML IV SOLN
1000.0000 mg | Freq: Once | INTRAVENOUS | Status: AC
  Administered 2013-06-11: 1000 mg via INTRAVENOUS
  Filled 2013-06-11: qty 100

## 2013-06-11 MED ORDER — DEXAMETHASONE SODIUM PHOSPHATE 10 MG/ML IJ SOLN
10.0000 mg | Freq: Every day | INTRAMUSCULAR | Status: AC
  Filled 2013-06-11: qty 1

## 2013-06-11 MED ORDER — SODIUM CHLORIDE 0.9 % IJ SOLN: 3.0000 mL | INTRAMUSCULAR | Status: DC | PRN

## 2013-06-11 MED ORDER — DSS 100 MG PO CAPS: 100.0000 mg | ORAL_CAPSULE | Freq: Two times a day (BID) | ORAL | Status: DC

## 2013-06-11 MED ORDER — NALOXONE HCL 0.4 MG/ML IJ SOLN: 0.4000 mg | INTRAMUSCULAR | Status: DC | PRN

## 2013-06-11 MED ORDER — METHOCARBAMOL 500 MG PO TABS
500.0000 mg | ORAL_TABLET | Freq: Four times a day (QID) | ORAL | Status: DC | PRN
  Administered 2013-06-13 – 2013-06-16 (×8): 500 mg via ORAL
  Filled 2013-06-11 (×8): qty 1

## 2013-06-11 MED ORDER — METOCLOPRAMIDE HCL 5 MG/ML IJ SOLN: 5.0000 mg | Freq: Three times a day (TID) | INTRAMUSCULAR | Status: DC | PRN

## 2013-06-11 MED ORDER — PANTOPRAZOLE SODIUM 40 MG PO TBEC
40.0000 mg | DELAYED_RELEASE_TABLET | Freq: Two times a day (BID) | ORAL | Status: DC
  Administered 2013-06-12 – 2013-06-16 (×9): 40 mg via ORAL
  Filled 2013-06-11 (×10): qty 1

## 2013-06-11 MED ORDER — DEXAMETHASONE SODIUM PHOSPHATE 10 MG/ML IJ SOLN
10.0000 mg | Freq: Once | INTRAMUSCULAR | Status: AC
  Administered 2013-06-11: 10 mg via INTRAVENOUS

## 2013-06-11 MED ORDER — MENTHOL 3 MG MT LOZG: 1.0000 | LOZENGE | OROMUCOSAL | Status: DC | PRN

## 2013-06-11 MED ORDER — ONDANSETRON HCL 4 MG/2ML IJ SOLN: 4.0000 mg | Freq: Three times a day (TID) | INTRAMUSCULAR | Status: DC | PRN

## 2013-06-11 MED ORDER — DIPHENHYDRAMINE HCL 12.5 MG/5ML PO ELIX: 12.5000 mg | ORAL_SOLUTION | ORAL | Status: DC | PRN

## 2013-06-11 MED ORDER — GLIPIZIDE 10 MG PO TABS
10.0000 mg | ORAL_TABLET | Freq: Two times a day (BID) | ORAL | Status: DC
  Administered 2013-06-12 – 2013-06-16 (×9): 10 mg via ORAL
  Filled 2013-06-11 (×11): qty 1

## 2013-06-11 MED ORDER — SODIUM CHLORIDE 0.9 % IV SOLN: INTRAVENOUS | Status: DC

## 2013-06-11 MED ORDER — POLYETHYLENE GLYCOL 3350 17 G PO PACK
17.0000 g | PACK | Freq: Every day | ORAL | Status: DC | PRN
  Administered 2013-06-13 – 2013-06-15 (×3): 17 g via ORAL

## 2013-06-11 MED ORDER — BUPIVACAINE HCL (PF) 0.5 % IJ SOLN
INTRAMUSCULAR | Status: DC | PRN
  Administered 2013-06-11: 10 mL via EPIDURAL

## 2013-06-11 MED ORDER — SODIUM CHLORIDE 0.9 % IV SOLN
INTRAVENOUS | Status: DC
  Administered 2013-06-12 (×2): via EPIDURAL
  Filled 2013-06-11 (×10): qty 25

## 2013-06-11 MED ORDER — SODIUM CHLORIDE 0.9 % IR SOLN
Status: DC | PRN
Start: 2013-06-11 — End: 2013-06-11
  Administered 2013-06-11: 1000 mL

## 2013-06-11 MED ORDER — METOCLOPRAMIDE HCL 5 MG/ML IJ SOLN: 10.0000 mg | Freq: Three times a day (TID) | INTRAMUSCULAR | Status: DC | PRN

## 2013-06-11 MED ORDER — DEXAMETHASONE SODIUM PHOSPHATE 10 MG/ML IJ SOLN
INTRAMUSCULAR | Status: AC
  Filled 2013-06-11: qty 1

## 2013-06-11 SURGICAL SUPPLY — 53 items
BAG ZIPLOCK 12X15 (MISCELLANEOUS) ×2 IMPLANT
BANDAGE ELASTIC 6 VELCRO ST LF (GAUZE/BANDAGES/DRESSINGS) ×4 IMPLANT
BANDAGE ESMARK 6X9 LF (GAUZE/BANDAGES/DRESSINGS) ×1 IMPLANT
BLADE SAG 18X100X1.27 (BLADE) ×2 IMPLANT
BLADE SAW SGTL 11.0X1.19X90.0M (BLADE) ×2 IMPLANT
BLADE SURG SZ10 CARB STEEL (BLADE) ×4 IMPLANT
BNDG COHESIVE 6X5 TAN STRL LF (GAUZE/BANDAGES/DRESSINGS) ×4 IMPLANT
BNDG ESMARK 6X9 LF (GAUZE/BANDAGES/DRESSINGS) ×2
BOWL SMART MIX CTS (DISPOSABLE) ×4 IMPLANT
CAP KNEE ATTUNE RP ×4 IMPLANT
CEMENT HV SMART SET (Cement) ×8 IMPLANT
CUFF TOURN SGL QUICK 34 (TOURNIQUET CUFF) ×2
CUFF TRNQT CYL 34X4X40X1 (TOURNIQUET CUFF) ×2 IMPLANT
DRAPE EXTREMITY BILATERAL (DRAPE) ×2 IMPLANT
DRAPE INCISE IOBAN 66X45 STRL (DRAPES) ×2 IMPLANT
DRAPE POUCH INSTRU U-SHP 10X18 (DRAPES) ×2 IMPLANT
DRAPE U-SHAPE 47X51 STRL (DRAPES) ×6 IMPLANT
DRSG ADAPTIC 3X8 NADH LF (GAUZE/BANDAGES/DRESSINGS) ×4 IMPLANT
DRSG PAD ABDOMINAL 8X10 ST (GAUZE/BANDAGES/DRESSINGS) ×4 IMPLANT
DURAPREP 26ML APPLICATOR (WOUND CARE) ×4 IMPLANT
ELECT REM PT RETURN 9FT ADLT (ELECTROSURGICAL) ×2
ELECTRODE REM PT RTRN 9FT ADLT (ELECTROSURGICAL) ×1 IMPLANT
EVACUATOR 1/8 PVC DRAIN (DRAIN) ×4 IMPLANT
FACESHIELD WRAPAROUND (MASK) ×16 IMPLANT
GLOVE BIO SURGEON STRL SZ7.5 (GLOVE) IMPLANT
GLOVE BIO SURGEON STRL SZ8 (GLOVE) ×4 IMPLANT
GLOVE BIOGEL PI IND STRL 8 (GLOVE) ×2 IMPLANT
GLOVE BIOGEL PI INDICATOR 8 (GLOVE) ×2
GLOVE ECLIPSE 8.0 STRL XLNG CF (GLOVE) ×4 IMPLANT
GOWN STRL REUS W/TWL LRG LVL3 (GOWN DISPOSABLE) ×2 IMPLANT
GOWN STRL REUS W/TWL XL LVL3 (GOWN DISPOSABLE) ×2 IMPLANT
HANDPIECE INTERPULSE COAX TIP (DISPOSABLE) ×1
IMMOBILIZER KNEE 20 (SOFTGOODS) ×4 IMPLANT
KIT BASIN OR (CUSTOM PROCEDURE TRAY) ×2 IMPLANT
MANIFOLD NEPTUNE II (INSTRUMENTS) ×2 IMPLANT
NS IRRIG 1000ML POUR BTL (IV SOLUTION) ×2 IMPLANT
PACK TOTAL JOINT (CUSTOM PROCEDURE TRAY) ×2 IMPLANT
PADDING CAST COTTON 6X4 STRL (CAST SUPPLIES) ×8 IMPLANT
SET HNDPC FAN SPRY TIP SCT (DISPOSABLE) ×1 IMPLANT
SPONGE GAUZE 4X4 12PLY (GAUZE/BANDAGES/DRESSINGS) ×4 IMPLANT
SPONGE LAP 18X18 X RAY DECT (DISPOSABLE) ×2 IMPLANT
STOCKINETTE 8 INCH (MISCELLANEOUS) ×2 IMPLANT
STRIP CLOSURE SKIN 1/2X4 (GAUZE/BANDAGES/DRESSINGS) ×4 IMPLANT
SUCTION FRAZIER 12FR DISP (SUCTIONS) ×2 IMPLANT
SUT MNCRL AB 4-0 PS2 18 (SUTURE) ×4 IMPLANT
SUT VIC AB 2-0 CT1 27 (SUTURE) ×7
SUT VIC AB 2-0 CT1 TAPERPNT 27 (SUTURE) ×7 IMPLANT
SUT VLOC 180 0 24IN GS25 (SUTURE) ×4 IMPLANT
TOWEL OR 17X26 10 PK STRL BLUE (TOWEL DISPOSABLE) ×4 IMPLANT
TRAY FOLEY CATH 14FRSI W/METER (CATHETERS) IMPLANT
TRAY FOLEY CATH 16FRSI W/METER (SET/KITS/TRAYS/PACK) ×2 IMPLANT
WATER STERILE IRR 1500ML POUR (IV SOLUTION) ×2 IMPLANT
WRAP KNEE MAXI GEL POST OP (GAUZE/BANDAGES/DRESSINGS) ×4 IMPLANT

## 2013-06-11 NOTE — Anesthesia Postprocedure Evaluation (Signed)
  Anesthesia Post-op Note  Patient: Darryl Chang  Procedure(s) Performed: Procedure(s) (LRB): TOTAL KNEE BILATERAL (Bilateral)  Patient Location: PACU  Anesthesia Type: Spinal and epidural  Level of Consciousness: awake and alert   Airway and Oxygen Therapy: Patient Spontanous Breathing  Post-op Pain: mild  Post-op Assessment: Post-op Vital signs reviewed, Patient's Cardiovascular Status Stable, Respiratory Function Stable, Patent Airway and No signs of Nausea or vomiting  Last Vitals:  Filed Vitals:   06/11/13 1700  BP: 131/77  Pulse:   Temp:   Resp:     Post-op Vital Signs: stable   Complications: No apparent anesthesia complications

## 2013-06-11 NOTE — Progress Notes (Signed)
PACU Nsg Note: Pt arrived in Phase 1 PACU w/ epidural in place, upon inspection and assessment of site, insertion site dsg noted to be saturated w/ blood, Dr B. Council Mechanicenenny, MD at bedside to assess site as well, dsg changed by MDA, orders rec'd to cont with epidural gtt as prev ordered for post op pain management

## 2013-06-11 NOTE — Op Note (Signed)
Pre-operative diagnosis- Osteoarthritis  Bilateral knee(s)  Post-operative diagnosis- Osteoarthritis Bilateral knee(s)  Procedure-  Bilateral  Total Knee Arthroplasty  Surgeon- Gus RankinFrank V. Gabby Rackers, MD  Assistant- Leilani AbleSteve Chabon, PA-C   Anesthesia-  Epidural and spinal  EBL- minimal Drains Hemovac x 1 each side  Tourniquet time- * Missing tourniquet times found for documented tourniquets in log:  [id number] * Total Tourniquet Time Documented: Thigh (Left) - 43 minutes Total: Thigh (Left) - 43 minutes  Thigh (Right) - 43 minutes Total: Thigh (Right) - 43 minutes    Complications- None  Condition-PACU - hemodynamically stable.   Brief Clinical Note  Darryl Chang is a 68 y.o. year old male with end stage OA of both knees with progressively worsening pain and dysfunction. He has constant pain, with activity and at rest and significant functional deficits with difficulties even with ADLs. He has had extensive non-op management including analgesics, injections of cortisone, and home exercise program, but remains in significant pain with significant dysfunction. We discussed the options of replacing both knees at the same time versus one at a time including the procedure, risks, potential complications, rehab course and pros and cons of each and he elected to proceed with bilateral total knee arthroplasty.He presents now for bilateral Total Knee Arthroplasty.    Procedure in detail---   The patient is brought into the operating room and positioned supine on the operating table. After successful administration of  Epidural and spinal,   a tourniquet is placed high on the  Bilateral thigh(s) and the lower extremities are prepped and draped in the usual sterile fashion. Time out is performed by the operating team and then the  Left lower extremity is wrapped in Esmarch, knee flexed and the tourniquet inflated to 300 mmHg.       A midline incision is made with a ten blade through the subcutaneous  tissue to the level of the extensor mechanism. A fresh blade is used to make a medial parapatellar arthrotomy. Soft tissue over the proximal medial tibia is subperiosteally elevated to the joint line with a knife and into the semimembranosus bursa with a Cobb elevator. Soft tissue over the proximal lateral tibia is elevated with attention being paid to avoiding the patellar tendon on the tibial tubercle. The patella is everted, knee flexed 90 degrees and the ACL and PCL are removed. Findings are bone on bone medial and patellofemoral with massive global osteophytes.        The drill is used to create a starting hole in the distal femur and the canal is thoroughly irrigated with sterile saline to remove the fatty contents. The 5 degree Left  valgus alignment guide is placed into the femoral canal and the distal femoral cutting block is pinned to remove 9 mm off the distal femur. Resection is made with an oscillating saw.      The tibia is subluxed forward and the menisci are removed. The extramedullary alignment guide is placed referencing proximally at the medial aspect of the tibial tubercle and distally along the second metatarsal axis and tibial crest. The block is pinned to remove 2mm off the more deficient medial  side. Resection is made with an oscillating saw. Size 7is the most appropriate size for the tibia and the proximal tibia is prepared with the modular drill and keel punch for that size.      The femoral sizing guide is placed and size 7 is most appropriate. Rotation is marked off the epicondylar axis and confirmed by creating  a rectangular flexion gap at 90 degrees. The size 7 cutting block is pinned in this rotation and the anterior, posterior and chamfer cuts are made with the oscillating saw. The intercondylar block is then placed and that cut is made.      Trial size 7 tibial component, trial size 7 posterior stabilized femur and a 8  mm posterior stabilized rotating platform insert trial is  placed. Full extension is achieved with excellent varus/valgus and anterior/posterior balance throughout full range of motion. The patella is everted and thickness measured to be 27  mm. Free hand resection is taken to 15 mm, a 41 template is placed, lug holes are drilled, trial patella is placed, and it tracks normally. Osteophytes are removed off the posterior femur with the trial in place. All trials are removed and the cut bone surfaces prepared with pulsatile lavage. Cement is mixed and once ready for implantation, the size 7 tibial implant, size  7 posterior stabilized femoral component, and the size 41 patella are cemented in place and the patella is held with the clamp. The trial insert is placed and the knee held in full extension.  All extruded cement is removed and once the cement is hard the permanent 8 mm posterior stabilized rotating platform insert is placed into the tibial tray.       The wound is copiously irrigated with saline solution and the extensor mechanism closed over a hemovac drain with #1 V-loc suture. The tourniquet is released for a total tourniquet time of 43  minutes. Flexion against gravity is 140 degrees and the patella tracks normally. Subcutaneous tissue is closed with 2.0 vicryl and subcuticular with running 4.0 Monocryl.       The  Right lower extremity is wrapped in Esmarch, knee flexed and the tourniquet inflated to 300 mmHg.       A midline incision is made with a ten blade through the subcutaneous tissue to the level of the extensor mechanism. A fresh blade is used to make a medial parapatellar arthrotomy. Soft tissue over the proximal medial tibia is subperiosteally elevated to the joint line with a knife and into the semimembranosus bursa with a Cobb elevator. Soft tissue over the proximal lateral tibia is elevated with attention being paid to avoiding the patellar tendon on the tibial tubercle. The patella is everted, knee flexed 90 degrees and the ACL and PCL are  removed. Findings are  bone on bone medial and patellofemoral with massive global osteophytes.     The drill is used to create a starting hole in the distal femur and the canal is thoroughly irrigated with sterile saline to remove the fatty contents. The 5 degree Right  valgus alignment guide is placed into the femoral canal and the distal femoral cutting block is pinned to remove 9 mm off the distal femur. Resection is made with an oscillating saw.      The tibia is subluxed forward and the menisci are removed. The extramedullary alignment guide is placed referencing proximally at the medial aspect of the tibial tubercle and distally along the second metatarsal axis and tibial crest. The block is pinned to remove 2mm off the more deficient medial  side. Resection is made with an oscillating saw. Size 7is the most appropriate size for the tibia and the proximal tibia is prepared with the modular drill and keel punch for that size.      The femoral sizing guide is placed and size 7 is most appropriate. Rotation is  marked off the epicondylar axis and confirmed by creating a rectangular flexion gap at 90 degrees. The size 7 cutting block is pinned in this rotation and the anterior, posterior and chamfer cuts are made with the oscillating saw. The intercondylar block is then placed and that cut is made.      Trial size 7 tibial component, trial size 7 posterior stabilized femur and a 8  mm posterior stabilized rotating platform insert trial is placed. Full extension is achieved with excellent varus/valgus and anterior/posterior balance throughout full range of motion. The patella is everted and thickness measured to be 27  mm. Free hand resection is taken to 15 mm, a 41 template is placed, lug holes are drilled, trial patella is placed, and it tracks normally. Osteophytes are removed off the posterior femur with the trial in place. All trials are removed and the cut bone surfaces prepared with pulsatile lavage.  Cement is mixed and once ready for implantation, the size 7 tibial implant, size  7 posterior stabilized femoral component, and the size 41 patella are cemented in place and the patella is held with the clamp. The trial insert is placed and the knee held in full extension.  All extruded cement is removed and once the cement is hard the permanent 8 mm posterior stabilized rotating platform insert is placed into the tibial tray.      The wound is copiously irrigated with saline solution and the extensor mechanism closed over a homovac drain with #1 PDS suture. The tourniquet is released for a total tourniquet time of 42  minutes. Flexion against gravity is 140 degrees and the patella tracks normally. Subcutaneous tissue is closed with 2.0 vicryl and subcuticular with running 4.0 Monocryl. The incision is cleaned and dried and steri-strips and a bulky sterile dressing are applied. The limb is placed into a knee immobilizer and the patient is awakened and transported to recovery in stable condition.      Please note that a surgical assistant was a medical necessity for this procedure in order to perform it in a safe and expeditious manner. Surgical assistant was necessary to retract the ligaments and vital neurovascular structures to prevent injury to them and also necessary for proper positioning of the limb to allow for anatomic placement of the prosthesis.   [name] Kimbria Camposano, MD    06/11/2013, 3:14 PM

## 2013-06-11 NOTE — Progress Notes (Addendum)
Did not place sacral dressing as anesthesia type has not been decided yet.  Read in orders epidural to be placed during surgery

## 2013-06-11 NOTE — H&P (View-Only) (Signed)
Darryl Chang DOB: 07/23/1945 Married / Language: English / Race: White Male  Date of Admission:  06-11-2013  Chief Complaint:  Bilateral Knee Pain  History of Present Illness The patient is a 68 year old male who comes in for a preoperative History and Physical. The patient is scheduled for a bilateral total knee arthroplasty to be performed by Dr. [name]. [name], MD at [facility] on 06-11-2013. The patient is a 68 year old male who presents with knee complaints. The patient is seen in referral from Dr [name]. The patient reports left knee and right knee symptoms including: pain which began year(s) ago without any known injury. He has [facility]. He has had cortisone and visco injections in the past. He reports that the cortisone injections helped for several months at a time but his last injection was most likely 2 years ago. He did not have any relief with the visco. He has never had surgery on the knees. His left knee is bothering him more than the right knee. He is getting to the point where his activity is severely limited. 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 He feels that he needs to have the knees replaced but is concerned that if he does he will not longer be able to do the work that he currently does. He states that both knees are hurting badly. 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 He said that he is doing more of a supervisory role now than actually getting down on his knees to do things. He said functionally the knees have gotten much worse. He is at a stage now where he feels as though is knees need to be fixed. Both knees are bothering him equally. He has had cortisone and viscosupplement injections without benefit. He is ready to proceed with bilateral knee replacements. They have been treated conservatively in the past for the above stated  problem and despite conservative measures, they continue to have progressive pain and severe functional limitations and dysfunction. They have failed non-operative management including home exercise, medications, and injections. It is felt that they would benefit from undergoing total joint replacement. Risks and benefits of the procedure have been discussed with the patient and they elect to proceed with surgery. There are no active contraindications to surgery such as ongoing infection or rapidly progressive neurological disease.   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 Mother.    Social History Marital status. married Never consumed alcohol. 02/21/2013: Never consumed alcohol Living situation. live with spouse Current work status. retired Exercise. Exercises rarely No history of drug/alcohol rehab Tobacco / smoke exposure. 02/21/2013: no Not under pain contract Number of flights of stairs before winded. less than 1 Children. 3 Tobacco use. Former smoker. 02/21/2013: smoke(d) 1 pack(s) per day Post-Surgical Plans. Inpatient Rehab - [facility] and [facility] Advance Directives. Living Will, Healthcare POA    Medication History Anafranil (50MG  Capsule, Oral) Active. Aspirin EC (81MG  Tablet DR, Oral) Active. GlipiZIDE (5MG  Tablet, Oral) Active. Lisinopril (20MG  Tablet, Oral) Active. MetFORMIN HCl (1000MG  Tablet, Oral) Active. Naproxen (500MG  Tablet, Oral) Active. Omeprazole (20MG  Capsule DR, Oral) Active. PROzac (20MG  Capsule,  Oral) Active. Simvastatin (20MG  Tablet, Oral) Active.    Past Surgical History Tonsillectomy Vasectomy Cataract Surgery.  bilateral    Review of Systems General:Not Present- Chills, Fever, Night Sweats, Fatigue, Weight Gain, Weight Loss and Memory Loss. Skin:Not Present- Hives, Itching, Rash, Eczema and Lesions. HEENT:Not Present- Tinnitus, Headache, Double Vision, Visual Loss, Hearing Loss and Dentures. Respiratory:Not Present- Shortness of breath with exertion, Shortness of breath at rest, Allergies, Coughing up blood and Chronic Cough. Cardiovascular:Not Present- Chest Pain, Racing/skipping heartbeats, Difficulty Breathing Lying Down, Murmur, Swelling and Palpitations. Gastrointestinal:Not Present- Bloody Stool, Heartburn, Abdominal Pain, Vomiting, Nausea, Constipation, Diarrhea, Difficulty Swallowing, Jaundice and Loss of appetitie. Male Genitourinary:Not Present- Urinary frequency, Blood in Urine, Weak urinary stream, Discharge, Flank Pain, Incontinence, Painful Urination, Urgency, Urinary Retention and Urinating at Night. Musculoskeletal:Not Present- Muscle Weakness, Muscle Pain, Joint Swelling, Joint Pain, Back Pain, Morning Stiffness and Spasms. Neurological:Not Present- Tremor, Dizziness, Blackout spells, Paralysis, Difficulty with balance and Weakness. Psychiatric:Not Present- Insomnia.    Vitals Pulse: 68 (Regular) Resp.: 14 (Unlabored) BP: 124/78 (Sitting, Right Arm, Standard)     Physical Exam([name], III PA-C; 05/15/2013 11:33 AM) The physical exam findings are as follows:   General Mental Status - Alert, cooperative and good historian. General Appearance- pleasant. Not in acute distress. Orientation- Oriented X3. Build & Nutrition- Well nourished and Well developed.   Head and Neck Head- normocephalic, atraumatic . Neck Global Assessment- supple. no bruit auscultated on the right and no bruit auscultated on the left.   Eye Pupil- Bilateral- Regular and Round. Motion- Bilateral- EOMI. wears glasses  ENMT : full upper and  partial lower dentures  Chest and Lung Exam Auscultation: Breath sounds:- clear at anterior chest wall and - clear at posterior chest wall. Adventitious sounds:- No Adventitious sounds.   Cardiovascular Auscultation:Rhythm- Regular rate and rhythm. Heart Sounds- S1 WNL and S2 WNL. Murmurs & Other Heart Sounds:Auscultation of the heart reveals - No Murmurs.   Abdomen Inspection:Contour- Generalized mild distention. Palpation/Percussion:Tenderness- Abdomen is non-tender to palpation. Rigidity (guarding)- Abdomen is soft. Auscultation:Auscultation of the abdomen reveals - Bowel sounds normal.   Male Genitourinary Not done, not pertinent to present illness  Musculoskeletal  On exam, very pleasant, well developed male alert and oriented in no apparent distress. Both hips show normal range of motion with no discomfort. Both knees show no effusion. He has varus deformity on both knees. His range of motion is about 5-125 on each side with marked crepitus on range of motion. He has tenderness medial greater than lateral with no instability noted.  RADIOGRAPHS: AP both knees and lateral show severe bone on bone arthritis medial and patellofemoral compartments of both knees with significant varus deformity in both.   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 Lequita HaltAluisio.  Plan is to go to inpatient rehab facility.  PCP - Dr. Carvel GettingPaul Settlle  The patient does not have any contraindications and will receive TXA (tranexamic acid) prior to surgery.  Signed electronically by Lauraine RinneAlexzandrew L Perkins, III PA-C

## 2013-06-11 NOTE — Anesthesia Preprocedure Evaluation (Signed)
Anesthesia Evaluation  Patient identified by MRN, date of birth, ID band Patient awake    Reviewed: Allergy & Precautions, H&P , NPO status , Patient's Chart, lab work & pertinent test results  Airway Mallampati: II TM Distance: >3 FB Neck ROM: Full    Dental no notable dental hx.    Pulmonary sleep apnea , former smoker,  breath sounds clear to auscultation  Pulmonary exam normal       Cardiovascular hypertension, Pt. on medications Rhythm:Regular Rate:Normal     Neuro/Psych PSYCHIATRIC DISORDERS Anxiety Depression negative neurological ROS     GI/Hepatic Neg liver ROS, GERD-  Medicated,  Endo/Other  negative endocrine ROSdiabetes  Renal/GU negative Renal ROS  negative genitourinary   Musculoskeletal negative musculoskeletal ROS (+)   Abdominal (+) + obese,   Peds negative pediatric ROS (+)  Hematology negative hematology ROS (+)   Anesthesia Other Findings   Reproductive/Obstetrics negative OB ROS                           Anesthesia Physical Anesthesia Plan  ASA: III  Anesthesia Plan: Spinal and Epidural   Post-op Pain Management:    Induction: Intravenous  Airway Management Planned:   Additional Equipment:   Intra-op Plan:   Post-operative Plan: Extubation in OR  Informed Consent: I have reviewed the patients History and Physical, chart, labs and discussed the procedure including the risks, benefits and alternatives for the proposed anesthesia with the patient or authorized representative who has indicated his/her understanding and acceptance.   Dental advisory given  Plan Discussed with: CRNA  Anesthesia Plan Comments: (Discussed risks/benefits of spinal including headache, backache, failure, bleeding, infection, and nerve damage. Patient consents to spinal. Questions answered. Coagulation studies and platelet count acceptable.)        Anesthesia Quick  Evaluation

## 2013-06-11 NOTE — Interval H&P Note (Signed)
History and Physical Interval Note:  06/11/2013 11:59 AM  Darryl Chang  has presented today for surgery, with the diagnosis of OSTEOARTHRITIS BILATERAL KNEES  The various methods of treatment have been discussed with the patient and family. After consideration of risks, benefits and other options for treatment, the patient has consented to  Procedure(s): TOTAL KNEE BILATERAL (Bilateral) as a surgical intervention .  The patient's history has been reviewed, patient examined, no change in status, stable for surgery.  I have reviewed the patient's chart and labs.  Questions were answered to the patient's satisfaction.     Gus RankinFrank V Caroll Weinheimer

## 2013-06-11 NOTE — Anesthesia Procedure Notes (Addendum)
Epidural Patient location during procedure: holding area  Staffing Anesthesiologist: Salley Scarlet Performed by: anesthesiologist   Preanesthetic Checklist Completed: patient identified, site marked, surgical consent, pre-op evaluation, timeout performed, IV checked, risks and benefits discussed, monitors and equipment checked and post-op pain management  Epidural Patient position: sitting Prep: Betadine Patient monitoring: heart rate, continuous pulse ox and blood pressure Approach: midline Location: L3-L4 Injection technique: LOR saline  Needle:  Needle type: Hustead  Needle gauge: 18 G Needle length: 9 cm and 9 Catheter type: closed end flexible Catheter size: 20 Guage Test dose: negative and 1.5% lidocaine  Additional Notes Test dose 1.5% Lidocaine with epi 1:200,000  Patient tolerated the insertion well without complications.Reason for block:post-op pain management  Spinal  Patient location during procedure: OR Staffing Anesthesiologist: Salley Scarlet Performed by: anesthesiologist  Preanesthetic Checklist Completed: patient identified, site marked, surgical consent, pre-op evaluation, timeout performed, IV checked, risks and benefits discussed and monitors and equipment checked Spinal Block Patient position: sitting Prep: Betadine Patient monitoring: heart rate, continuous pulse ox and blood pressure Injection technique: single-shot Needle Needle type: Whitacre  Needle gauge: 27 G Needle length: 9 cm Additional Notes Expiration date of kit checked and confirmed. Patient tolerated procedure well, without complications. Spinal needle 27 GA placed through special hustead needle. CSF clear. No paresthesia.

## 2013-06-11 NOTE — Transfer of Care (Signed)
Immediate Anesthesia Transfer of Care Note  Patient: Darryl Chang  Procedure(s) Performed: Procedure(s): TOTAL KNEE BILATERAL (Bilateral)  Patient Location: PACU  Anesthesia Type:Spinal  Level of Consciousness: awake, oriented and patient cooperative  Airway & Oxygen Therapy: Patient Spontanous Breathing and Patient connected to face mask oxygen  Post-op Assessment: Report given to PACU RN and Post -op Vital signs reviewed and stable  Post vital signs: stable  Complications: No apparent anesthesia complications

## 2013-06-12 ENCOUNTER — Encounter (HOSPITAL_COMMUNITY): Payer: Self-pay | Admitting: Orthopedic Surgery

## 2013-06-12 DIAGNOSIS — Z96659 Presence of unspecified artificial knee joint: Secondary | ICD-10-CM

## 2013-06-12 DIAGNOSIS — M1710 Unilateral primary osteoarthritis, unspecified knee: Secondary | ICD-10-CM

## 2013-06-12 LAB — CBC
HCT: 27.1 % — ABNORMAL LOW (ref 39.0–52.0)
Hemoglobin: 9.2 g/dL — ABNORMAL LOW (ref 13.0–17.0)
MCH: 27.1 pg (ref 26.0–34.0)
MCHC: 33.9 g/dL (ref 30.0–36.0)
MCV: 79.9 fL (ref 78.0–100.0)
Platelets: 181 10*3/uL (ref 150–400)
RBC: 3.39 MIL/uL — AB (ref 4.22–5.81)
RDW: 13.2 % (ref 11.5–15.5)
WBC: 12.2 10*3/uL — ABNORMAL HIGH (ref 4.0–10.5)

## 2013-06-12 LAB — BASIC METABOLIC PANEL
BUN: 28 mg/dL — ABNORMAL HIGH (ref 6–23)
CO2: 24 mEq/L (ref 19–32)
Calcium: 8.5 mg/dL (ref 8.4–10.5)
Chloride: 100 mEq/L (ref 96–112)
Creatinine, Ser: 0.97 mg/dL (ref 0.50–1.35)
GFR calc Af Amer: >90 mL/min (ref >90)
GFR, EST NON AFRICAN AMERICAN: 82 mL/min — AB (ref >90)
Glucose, Bld: 215 mg/dL — ABNORMAL HIGH (ref 70–99)
POTASSIUM: 4.6 meq/L (ref 3.7–5.3)
SODIUM: 136 meq/L — AB (ref 137–147)

## 2013-06-12 LAB — GLUCOSE, CAPILLARY
Glucose-Capillary: 100 mg/dL — ABNORMAL HIGH (ref 70–99)
Glucose-Capillary: 179 mg/dL — ABNORMAL HIGH (ref 70–99)
Glucose-Capillary: 141 mg/dL — ABNORMAL HIGH (ref 70–99)
Glucose-Capillary: 200 mg/dL — ABNORMAL HIGH (ref 70–99)
Glucose-Capillary: 228 mg/dL — ABNORMAL HIGH (ref 70–99)

## 2013-06-12 LAB — PROTIME-INR
INR: 1.14 (ref 0.00–1.49)
PROTHROMBIN TIME: 14.4 s (ref 11.6–15.2)

## 2013-06-12 MED ORDER — WARFARIN - PHARMACIST DOSING INPATIENT: Freq: Every day | Status: DC

## 2013-06-12 MED ORDER — SODIUM CHLORIDE 0.9 % IV SOLN
INTRAVENOUS | Status: DC
  Administered 2013-06-13: via EPIDURAL
  Filled 2013-06-12 (×3): qty 25

## 2013-06-12 MED ORDER — WARFARIN VIDEO
Freq: Once | Status: AC
  Administered 2013-06-13: 12:00:00

## 2013-06-12 MED ORDER — WARFARIN SODIUM 4 MG PO TABS
4.0000 mg | ORAL_TABLET | Freq: Once | ORAL | Status: AC
  Administered 2013-06-12: 4 mg via ORAL
  Filled 2013-06-12: qty 1

## 2013-06-12 MED ORDER — COUMADIN BOOK
1.0000 | Freq: Once | Status: AC
  Administered 2013-06-12: 1
  Filled 2013-06-12: qty 1

## 2013-06-12 NOTE — Discharge Instructions (Addendum)
° °Dr. Tarron Krolak °Total Joint Specialist °[facility] °[address] °[place], [place] [address] °[phone] ° °TOTAL KNEE REPLACEMENT POSTOPERATIVE DIRECTIONS ° ° ° °Knee Rehabilitation, Guidelines Following Surgery  °Results after knee surgery are often greatly improved when you follow the exercise, range of motion and muscle strengthening exercises prescribed by your doctor. Safety measures are also important to protect the knee from further injury. Any time any of these exercises cause you to have increased pain or swelling in your knee joint, decrease the amount until you are comfortable again and slowly increase them. If you have problems or questions, call your caregiver or physical therapist for advice.  ° °HOME CARE INSTRUCTIONS  °Remove items at home which could result in a fall. This includes throw rugs or furniture in walking pathways.  °Continue medications as instructed at time of discharge. °You may have some home medications which will be placed on hold until you complete the course of blood thinner medication.  °You may start showering once you are discharged home but do not submerge the incision under water. Just pat the incision dry and apply a dry gauze dressing on daily. °Walk with walker as instructed.  °You may resume a sexual relationship in one month or when given the OK by  your doctor.  °· Use walker as long as suggested by your caregivers. °· Avoid periods of inactivity such as sitting longer than an hour when not asleep. This helps prevent blood clots.  °You may put full weight on your legs and walk as much as is comfortable.  °You may return to work once you are cleared by your doctor.  °Do not drive a car for 6 weeks or until released by you surgeon.  °· Do not drive while taking narcotics.  °Wear the elastic stockings for three weeks following surgery during the day but you may remove then at night. °Make sure you keep all of your appointments after your  operation with all of your doctors and caregivers. You should call the office at the above phone number and make an appointment for approximately two weeks after the date of your surgery. °Change the dressing daily and reapply a dry dressing each time. °Please pick up a stool softener and laxative for home use as long as you are requiring pain medications. °· Continue to use ice on the knee for pain and swelling from surgery. You may notice swelling that will progress down to the foot and ankle.  This is normal after surgery.  Elevate the leg when you are not up walking on it.   °It is important for you to complete the blood thinner medication as prescribed by your doctor. °· Continue to use the breathing machine which will help keep your temperature down.  It is common for your temperature to cycle up and down following surgery, especially at night when you are not up moving around and exerting yourself.  The breathing machine keeps your lungs expanded and your temperature down. ° °RANGE OF MOTION AND STRENGTHENING EXERCISES  °Rehabilitation of the knee is important following a knee injury or an operation. After just a few days of immobilization, the muscles of the thigh which control the knee become weakened and shrink (atrophy). Knee exercises are designed to build up the tone and strength of the thigh muscles and to improve knee motion. Often times heat used for twenty to thirty minutes before working out will loosen up your tissues and help with improving the   range of motion but do not use heat for the first two weeks following surgery. These exercises can be done on a training (exercise) mat, on the floor, on a table or on a bed. Use what ever works the best and is most comfortable for you Knee exercises include:  Leg Lifts - While your knee is still immobilized in a splint or cast, you can do straight leg raises. Lift the leg to 60 degrees, hold for 3 sec, and slowly lower the leg. Repeat 10-20 times 2-3  times daily. Perform this exercise against resistance later as your knee gets better.  Quad and Hamstring Sets - Tighten up the muscle on the front of the thigh (Quad) and hold for 5-10 sec. Repeat this 10-20 times hourly. Hamstring sets are done by pushing the foot backward against an object and holding for 5-10 sec. Repeat as with quad sets.  A rehabilitation program following serious knee injuries can speed recovery and prevent re-injury in the future due to weakened muscles. Contact your doctor or a physical therapist for more information on knee rehabilitation.   SKILLED REHAB INSTRUCTIONS: If the patient is transferred to a skilled rehab facility following release from the hospital, a list of the current medications will be sent to the facility for the patient to continue.  When discharged from the skilled rehab facility, please have the facility set up the patient's Home Health Physical Therapy prior to being released. Also, the skilled facility will be responsible for providing the patient with their medications at time of release from the facility to include their pain medication, the muscle relaxants, and their blood thinner medication. If the patient is still at the rehab facility at time of the two week follow up appointment, the skilled rehab facility will also need to assist the patient in arranging follow up appointment in our office and any transportation needs.  MAKE SURE YOU:  Understand these instructions.  Will watch your condition.  Will get help right away if you are not doing well or get worse.    Pick up stool softner and laxative for home. Do not submerge incision under water. May shower. Continue to use ice for pain and swelling from surgery.           Information on my medicine - Coumadin   (Warfarin)  This medication education was reviewed with me or my healthcare representative as part of my discharge preparation.  The pharmacist that spoke with me during my  hospital stay was:  Dannielle Huh, [facility]  Why was Coumadin prescribed for you? Coumadin was prescribed for you because you have a blood clot or a medical condition that can cause an increased risk of forming blood clots. Blood clots can cause serious health problems by blocking the flow of blood to the heart, lung, or brain. Coumadin can prevent harmful blood clots from forming. As a reminder your indication for Coumadin is:   Blood Clot Prevention After Orthopedic Surgery  What test will check on my response to Coumadin? While on Coumadin (warfarin) you will need to have an INR test regularly to ensure that your dose is keeping you in the desired range. The INR (international normalized ratio) number is calculated from the result of the laboratory test called prothrombin time (PT).  If an INR APPOINTMENT HAS NOT ALREADY BEEN MADE FOR YOU please schedule an appointment to have this lab work done by your health care provider within 7 days. Your INR goal is usually a  number between:  2 to 3 or your provider may give you a more narrow range like 2-2.5.  Ask your health care provider during an office visit what your goal INR is.  What  do you need to  know  About  COUMADIN? Take Coumadin (warfarin) exactly as prescribed by your healthcare provider about the same time each day.  DO NOT stop taking without talking to the doctor who prescribed the medication.  Stopping without other blood clot prevention medication to take the place of Coumadin may increase your risk of developing a new clot or stroke.  Get refills before you run out.  What do you do if you miss a dose? If you miss a dose, take it as soon as you remember on the same day then continue your regularly scheduled regimen the next day.  Do not take two doses of Coumadin at the same time.  Important Safety Information A possible side effect of Coumadin (Warfarin) is an increased risk of bleeding. You should call your healthcare provider  right away if you experience any of the following:   Bleeding from an injury or your nose that does not stop.   Unusual colored urine (red or dark brown) or unusual colored stools (red or black).   Unusual bruising for unknown reasons.   A serious fall or if you hit your head (even if there is no bleeding).  Some foods or medicines interact with Coumadin (warfarin) and might alter your response to warfarin. To help avoid this:   Eat a balanced diet, maintaining a consistent amount of Vitamin K.   Notify your provider about major diet changes you plan to make.   Avoid alcohol or limit your intake to 1 drink for women and 2 drinks for men per day. (1 drink is 5 oz. wine, 12 oz. beer, or 1.5 oz. liquor.)  Make sure that ANY health care provider who prescribes medication for you knows that you are taking Coumadin (warfarin).  Also make sure the healthcare provider who is monitoring your Coumadin knows when you have started a new medication including herbals and non-prescription products.  Coumadin (Warfarin)  Major Drug Interactions  Increased Warfarin Effect Decreased Warfarin Effect  Alcohol (large quantities) Antibiotics (esp. Septra/Bactrim, Flagyl, Cipro) Amiodarone (Cordarone) Aspirin (ASA) Cimetidine (Tagamet) Megestrol (Megace) NSAIDs (ibuprofen, naproxen, etc.) Piroxicam (Feldene) Propafenone (Rythmol SR) Propranolol (Inderal) Isoniazid (INH) Posaconazole (Noxafil) Barbiturates (Phenobarbital) Carbamazepine (Tegretol) Chlordiazepoxide (Librium) Cholestyramine (Questran) Griseofulvin Oral Contraceptives Rifampin Sucralfate (Carafate) Vitamin K   Coumadin (Warfarin) Major Herbal Interactions  Increased Warfarin Effect Decreased Warfarin Effect  Garlic Ginseng Ginkgo biloba Coenzyme Q10 Green tea St. Johns wort    Coumadin (Warfarin) FOOD Interactions  Eat a consistent number of servings per week of foods HIGH in Vitamin K (1 serving =  cup)  Collards (cooked,  or boiled & drained) Kale (cooked, or boiled & drained) Mustard greens (cooked, or boiled & drained) Parsley *serving size only =  cup Spinach (cooked, or boiled & drained) Swiss chard (cooked, or boiled & drained) Turnip greens (cooked, or boiled & drained)  Eat a consistent number of servings per week of foods MEDIUM-HIGH in Vitamin K (1 serving = 1 cup)  Asparagus (cooked, or boiled & drained) Broccoli (cooked, boiled & drained, or raw & chopped) Brussel sprouts (cooked, or boiled & drained) *serving size only =  cup Lettuce, raw (green leaf, endive, romaine) Spinach, raw Turnip greens, raw & chopped   These websites have more information on Coumadin (warfarin):  http://www.king-russell.com/www.coumadin.com; https://www.hines.net/www.ahrq.gov/consumer/coumadin.htm;

## 2013-06-12 NOTE — Care Management Note (Signed)
    Page 1 of 1   06/12/2013     1:55:34 PM   CARE MANAGEMENT NOTE 06/12/2013  Patient:  Darryl Chang,Darryl Chang   Account Number:  [id number]  Date Initiated:  06/12/2013  Documentation initiated by:  Algernon HuxleyMIRINGU,Holdyn Poyser  Subjective/Objective Assessment:   68 year old male admitted s/p Bilateral  Total Knee Arthroplasty.     Action/Plan:   CIR at d/c.   Anticipated DC Date:  06/15/2013   Anticipated DC Plan:  IP REHAB FACILITY      DC Planning Services  CM consult      Choice offered to / List presented to:             Status of service:  In process, will continue to follow Medicare Important Message given?  NA - LOS <3 / Initial given by admissions (If response is "NO", the following Medicare IM given date fields will be blank) Date Medicare IM given:   Date Additional Medicare IM given:    Discharge Disposition:  IP REHAB FACILITY  Per UR Regulation:  Reviewed for med. necessity/level of care/duration of stay  If discussed at Long Length of Stay Meetings, dates discussed:    Comments:

## 2013-06-12 NOTE — Consult Note (Signed)
Physical Medicine and Rehabilitation Consult Reason for Consult: Bilateral total knee arthroplasty Referring Physician: Dr.Alusio   HPI: Darryl Chang is a 68 y.o. right-handed male admitted 06/11/2013 with progressive bilateral knee pain left greater than right and no relief with conservative care. Underwent bilateral total knee arthroplasties 06/11/2013 per Dr.Alusio. Postoperative pain management. Coumadin for DVT prophylaxis. Weightbearing as tolerated bilateral lower extremities. Physical and occupational therapy evaluations are pending. M.D. has requested physical medicine rehabilitation consult  No Feeling in either leg, unable to move either leg, has not been out of bed since surgery Review of Systems  Gastrointestinal:       GERD  Musculoskeletal: Positive for joint pain and myalgias.  Psychiatric/Behavioral: Positive for depression.       Anxiety  All other systems reviewed and are negative.  Past Medical History  Diagnosis Date  . Diabetes mellitus without complication   . Hyperlipidemia   . Hypertension   . Sleep apnea     USES CPAP SETTING 14  . Arthritis     KNEES AND SHOULDERS  . Anxiety   . Depression   . H/O diverticulitis of colon   . GERD (gastroesophageal reflux disease)    Past Surgical History  Procedure Laterality Date  . Tonsillectomy    . Vasectomy    . Eye surgery      BILATERAL CATARACT EXTRACTION AND LENS IMPLANTS   History reviewed. No pertinent family history. Social History:  reports that he has quit smoking. His smoking use included Cigarettes. He has a 15 pack-year smoking history. He does not have any smokeless tobacco history on file. He reports that he does not drink alcohol or use illicit drugs. Allergies:  Allergies  Allergen Reactions  . Other     NESTLE CHOCOLATE DRINK - ? COCOA   Medications Prior to Admission  Medication Sig Dispense Refill  . cholecalciferol (VITAMIN D) 1000 UNITS tablet Take 5,000 Units by mouth  every morning.       Marland Kitchen FLUoxetine (PROZAC) 20 MG tablet Take 20 mg by mouth 2 (two) times daily.      Marland Kitchen glipiZIDE (GLUCOTROL) 5 MG tablet Take 10 mg by mouth 2 (two) times daily.       Marland Kitchen lisinopril (PRINIVIL,ZESTRIL) 20 MG tablet Take 20 mg by mouth every morning.      . metFORMIN (GLUCOPHAGE) 1000 MG tablet Take 1,000 mg by mouth 2 (two) times daily with a meal.      . naproxen (NAPROSYN) 500 MG tablet Take 500 mg by mouth 2 (two) times daily with a meal.      . omeprazole (PRILOSEC) 20 MG capsule Take 20 mg by mouth 2 (two) times daily.       . simvastatin (ZOCOR) 20 MG tablet Take 20 mg by mouth at bedtime.      . [DISCONTINUED] aspirin EC 81 MG tablet Take 81 mg by mouth at bedtime.        Home: Home Living Family/patient expects to be discharged to:: Inpatient rehab Living Arrangements: Spouse/significant other  Functional History:   Functional Status:  Mobility:          ADL:    Cognition: Cognition Orientation Level: Oriented X4    Blood pressure 111/54, pulse 87, temperature 97.7 F (36.5 C), temperature source Oral, resp. rate 16, height 5\' 9"  (1.753 m), weight 255 lb (115.667 kg), SpO2 96.00%. Physical Exam  Constitutional: He is oriented to person, place, and time. He appears well-developed.  HENT:  Head: Normocephalic.  Eyes: EOM are normal.  Neck: Normal range of motion. Neck supple. No thyromegaly present.  Cardiovascular: Normal rate and regular rhythm.   Respiratory: Effort normal and breath sounds normal. No respiratory distress.  GI: Soft. Bowel sounds are normal. He exhibits no distension.  Neurological: He is alert and oriented to person, place, and time.  Skin:  Bilateral knee incisions clean and dry and appropriately tender   extremities show 1+ pedal edema. Normal dorsalis pedis pulses  sensation is absent to light touch and proprioception in both feet Upper extremity strength is 5/5 bilateral deltoid, bicep, tricep, grip Lower ext strength is  0 in the hip flexors knee extensors ankle dorsiflexors plantar flexors  Results for orders placed during the hospital encounter of 06/11/13 (from the past 24 hour(s))  URINALYSIS, ROUTINE W REFLEX MICROSCOPIC     Status: None   Collection Time    06/11/13 11:04 AM      Result Value Ref Range   Color, Urine YELLOW  YELLOW   APPearance CLEAR  CLEAR   Specific Gravity, Urine 1.027  1.005 - 1.030   pH 7.0  5.0 - 8.0   Glucose, UA NEGATIVE  NEGATIVE mg/dL   Hgb urine dipstick NEGATIVE  NEGATIVE   Bilirubin Urine NEGATIVE  NEGATIVE   Ketones, ur NEGATIVE  NEGATIVE mg/dL   Protein, ur NEGATIVE  NEGATIVE mg/dL   Urobilinogen, UA 0.2  0.0 - 1.0 mg/dL   Nitrite NEGATIVE  NEGATIVE   Leukocytes, UA NEGATIVE  NEGATIVE  CBC     Status: Abnormal   Collection Time    06/11/13 11:20 AM      Result Value Ref Range   WBC 7.7  4.0 - 10.5 K/uL   RBC 4.47  4.22 - 5.81 MIL/uL   Hemoglobin 12.1 (*) 13.0 - 17.0 g/dL   HCT 04.535.8 (*) 40.939.0 - 81.152.0 %   MCV 80.1  78.0 - 100.0 fL   MCH 27.1  26.0 - 34.0 pg   MCHC 33.8  30.0 - 36.0 g/dL   RDW 91.413.3  78.211.5 - 95.615.5 %   Platelets 209  150 - 400 K/uL  COMPREHENSIVE METABOLIC PANEL     Status: Abnormal   Collection Time    06/11/13 11:20 AM      Result Value Ref Range   Sodium 141  137 - 147 mEq/L   Potassium 4.5  3.7 - 5.3 mEq/L   Chloride 101  96 - 112 mEq/L   CO2 28  19 - 32 mEq/L   Glucose, Bld 127 (*) 70 - 99 mg/dL   BUN 25 (*) 6 - 23 mg/dL   Creatinine, Ser 2.131.04  0.50 - 1.35 mg/dL   Calcium 9.6  8.4 - 08.610.5 mg/dL   Total Protein 6.9  6.0 - 8.3 g/dL   Albumin 4.2  3.5 - 5.2 g/dL   AST 23  0 - 37 U/L   ALT 26  0 - 53 U/L   Alkaline Phosphatase 44  39 - 117 U/L   Total Bilirubin 0.3  0.3 - 1.2 mg/dL   GFR calc non Af Amer 71 (*) >90 mL/min   GFR calc Af Amer 83 (*) >90 mL/min  GLUCOSE, CAPILLARY     Status: Abnormal   Collection Time    06/11/13 11:22 AM      Result Value Ref Range   Glucose-Capillary 117 (*) 70 - 99 mg/dL   Comment 1 Documented in  Chart    GLUCOSE, CAPILLARY  Status: Abnormal   Collection Time    06/11/13  3:32 PM      Result Value Ref Range   Glucose-Capillary 150 (*) 70 - 99 mg/dL  GLUCOSE, CAPILLARY     Status: Abnormal   Collection Time    06/11/13  6:01 PM      Result Value Ref Range   Glucose-Capillary 160 (*) 70 - 99 mg/dL  GLUCOSE, CAPILLARY     Status: Abnormal   Collection Time    06/11/13 10:09 PM      Result Value Ref Range   Glucose-Capillary 229 (*) 70 - 99 mg/dL  CBC     Status: Abnormal   Collection Time    06/12/13  4:45 AM      Result Value Ref Range   WBC 12.2 (*) 4.0 - 10.5 K/uL   RBC 3.39 (*) 4.22 - 5.81 MIL/uL   Hemoglobin 9.2 (*) 13.0 - 17.0 g/dL   HCT 40.9 (*) 81.1 - 91.4 %   MCV 79.9  78.0 - 100.0 fL   MCH 27.1  26.0 - 34.0 pg   MCHC 33.9  30.0 - 36.0 g/dL   RDW 78.2  95.6 - 21.3 %   Platelets 181  150 - 400 K/uL  BASIC METABOLIC PANEL     Status: Abnormal   Collection Time    06/12/13  4:45 AM      Result Value Ref Range   Sodium 136 (*) 137 - 147 mEq/L   Potassium 4.6  3.7 - 5.3 mEq/L   Chloride 100  96 - 112 mEq/L   CO2 24  19 - 32 mEq/L   Glucose, Bld 215 (*) 70 - 99 mg/dL   BUN 28 (*) 6 - 23 mg/dL   Creatinine, Ser 0.86  0.50 - 1.35 mg/dL   Calcium 8.5  8.4 - 57.8 mg/dL   GFR calc non Af Amer 82 (*) >90 mL/min   GFR calc Af Amer >90  >90 mL/min  PROTIME-INR     Status: None   Collection Time    06/12/13  4:45 AM      Result Value Ref Range   Prothrombin Time 14.4  11.6 - 15.2 seconds   INR 1.14  0.00 - 1.49   No results found.  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 Does the need for close, 24 hr/day medical supervision in concert with the patient's rehab needs make it unreasonable for this patient to be served in a less intensive setting? Potentially 2. Co-Morbidities requiring supervision/potential complications: Diabetes, hypertension, acute blood loss anemia 3. Due to bladder  management, bowel management, safety, skin/wound care, disease management, medication administration, pain management and patient education, does the patient require 24 hr/day rehab nursing? Potentially 4. Does the patient require coordinated care of a physician, rehab nurse, PT (1-2 hrs/day, 5 days/week) and OT (1-2 hrs/day, 5 days/week) to address physical and functional deficits in the context of the above medical diagnosis(es)? Potentially Addressing deficits in the following areas: balance, endurance, locomotion, strength, transferring, bowel/bladder control, bathing, dressing, feeding, grooming, toileting and cognition 5. Can the patient actively participate in an intensive therapy program of at least 3 hrs of therapy per day at least 5 days per week? Potentially 6. The potential for patient to make measurable gains while on inpatient rehab is good 7. Anticipated functional outcomes upon discharge from inpatient rehab are modified independent  with PT, modified independent with OT, n/a with SLP. 8.  Estimated rehab length of stay to reach the above functional goals is: 5-7 days 9. Does the patient have adequate social supports to accommodate these discharge functional goals? Potentially 10. Anticipated D/C setting: Home 11. Anticipated post D/C treatments: HH therapy 12. Overall Rehab/Functional Prognosis: excellent  RECOMMENDATIONS: This patient's condition is appropriate for continued rehabilitative care in the following setting: CIR once tolerating therapy after epidural catheter is removed Patient has agreed to participate in recommended program. Potentially Note that insurance prior authorization may be required for reimbursement for recommended care.  Comment:     06/12/2013

## 2013-06-12 NOTE — Progress Notes (Addendum)
Anesthesia pain note Epidural for bilateral TKA  Pain well controlled Site CDI Virtually no motor function in LE  Decreased rate to 8 ml/hr. Narcotics are reasonable if patient becomes uncomfortable. Please call if pain becomes too severe and we will increase epidural infusion rate. Likely D/C catheter tomorrow  Darryl Chang [phone]

## 2013-06-12 NOTE — Progress Notes (Signed)
Utilization review completed.  

## 2013-06-12 NOTE — Progress Notes (Signed)
Spoke with patient in regards to CPAP HS, states preference to self administer tonight when ready. Rested well prior night--added sterile water for humidification, pt using home mask/tubing. Verbalized understanding to call if any additional assistance needed.

## 2013-06-12 NOTE — Progress Notes (Signed)
CSW met with pt to assist with d/c planning. Pt hopes to have rehab at [facility] following hospital d/c. CSW is available to assist pt with SNF placement if CIR is unable to assist. CSW will follow until disposition is determined.  Werner Lean LCSW [phone]

## 2013-06-12 NOTE — Discharge Summary (Signed)
Physician Discharge Summary   Patient ID: Darryl Chang MRN: [id number] DOB/AGE: 09-09-1945 68 y.o.  Admit date: 06/11/2013 Discharge date: 06/14/2013  Primary Diagnosis: Osteoarthritis, bilateral knees    S/P bilateral total knee arthroplasties  Admission Diagnoses:  Past Medical History  Diagnosis Date  . Diabetes mellitus without complication   . Hyperlipidemia   . Hypertension   . Sleep apnea     USES CPAP SETTING 14  . Arthritis     KNEES AND SHOULDERS  . Anxiety   . Depression   . H/O diverticulitis of colon   . GERD (gastroesophageal reflux disease)    Discharge Diagnoses:   Principal Problem:   OA (osteoarthritis) of knee  Estimated body mass index is 37.64 kg/(m^2) as calculated from the following:   Height as of this encounter: 5' 9"  (1.753 m).   Weight as of this encounter: 115.667 kg (255 lb).  Procedure:  Procedure(s) (LRB): TOTAL KNEE BILATERAL (Bilateral)   Consults: None  HPI: The patient is a 68 year old male who presents with knee complaints. The patient is seen in referral from Dr Amedeo Plenty. The patient reports left knee and right knee symptoms including: pain which began year(s) ago without any known injury. He has been seen by his PCP and by the [facility] over the past few years. He has had cortisone and visco injections in the past. He reports that the cortisone injections helped for several months at a time but his last injection was most likely 2 years ago. He did not have any relief with the visco. He has never had surgery on the knees. His left knee is bothering him more than the right knee. He is getting to the point where his activity is severely limited. He is semi-retired now. 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 He feels that he needs to have the knees replaced but is concerned that if he does he will not longer be able to do the work that he currently does.  He states that both knees are hurting badly. He is a very active man and despite  retirement still remodels bathrooms. He said that he is doing more of a supervisory role now than actually getting down on his knees to do things. He said functionally the knees have gotten much worse. He is at a stage now where he feels as though is knees need to be fixed. Both knees are bothering him equally. He has had cortisone and viscosupplement injections without benefit.  He is ready to proceed with bilateral knee replacements.  They have been treated conservatively in the past for the above stated problem and despite conservative measures, they continue to have progressive pain and severe functional limitations and dysfunction. They have failed non-operative management including home exercise, medications, and injections. It is felt that they would benefit from undergoing total joint replacement. Risks and benefits of the procedure have been discussed with the patient and they elect to proceed with surgery. There are no active contraindications to surgery such as ongoing infection or rapidly progressive neurological disease.     Laboratory Data: Admission on 06/11/2013  Component Date Value Ref Range Status  . WBC 06/11/2013 7.7  4.0 - 10.5 K/uL Final  . RBC 06/11/2013 4.47  4.22 - 5.81 MIL/uL Final  . Hemoglobin 06/11/2013 12.1* 13.0 - 17.0 g/dL Final  . HCT 06/11/2013 35.8* 39.0 - 52.0 % Final  . MCV 06/11/2013 80.1  78.0 - 100.0 fL Final  . MCH 06/11/2013 27.1  26.0 - 34.0 pg Final  . MCHC 06/11/2013 33.8  30.0 - 36.0 g/dL Final  . RDW 06/11/2013 13.3  11.5 - 15.5 % Final  . Platelets 06/11/2013 209  150 - 400 K/uL Final  . Sodium 06/11/2013 141  137 - 147 mEq/L Final  . Potassium 06/11/2013 4.5  3.7 - 5.3 mEq/L Final  . Chloride 06/11/2013 101  96 - 112 mEq/L Final  . CO2 06/11/2013 28  19 - 32 mEq/L Final  . Glucose, Bld 06/11/2013 127* 70 - 99 mg/dL Final  . BUN 06/11/2013 25* 6 - 23 mg/dL Final  . Creatinine, Ser 06/11/2013 1.04  0.50 - 1.35 mg/dL Final  . Calcium 06/11/2013  9.6  8.4 - 10.5 mg/dL Final  . Total Protein 06/11/2013 6.9  6.0 - 8.3 g/dL Final  . Albumin 06/11/2013 4.2  3.5 - 5.2 g/dL Final  . AST 06/11/2013 23  0 - 37 U/L Final  . ALT 06/11/2013 26  0 - 53 U/L Final  . Alkaline Phosphatase 06/11/2013 44  39 - 117 U/L Final  . Total Bilirubin 06/11/2013 0.3  0.3 - 1.2 mg/dL Final  . GFR calc non Af Amer 06/11/2013 71* >90 mL/min Final  . GFR calc Af Amer 06/11/2013 83* >90 mL/min Final   Comment: (NOTE)                          The eGFR has been calculated using the CKD EPI equation.                          This calculation has not been validated in all clinical situations.                          eGFR's persistently <90 mL/min signify possible Chronic Kidney                          Disease.  . Color, Urine 06/11/2013 YELLOW  YELLOW Final  . APPearance 06/11/2013 CLEAR  CLEAR Final  . Specific Gravity, Urine 06/11/2013 1.027  1.005 - 1.030 Final  . pH 06/11/2013 7.0  5.0 - 8.0 Final  . Glucose, UA 06/11/2013 NEGATIVE  NEGATIVE mg/dL Final  . Hgb urine dipstick 06/11/2013 NEGATIVE  NEGATIVE Final  . Bilirubin Urine 06/11/2013 NEGATIVE  NEGATIVE Final  . Ketones, ur 06/11/2013 NEGATIVE  NEGATIVE mg/dL Final  . Protein, ur 06/11/2013 NEGATIVE  NEGATIVE mg/dL Final  . Urobilinogen, UA 06/11/2013 0.2  0.0 - 1.0 mg/dL Final  . Nitrite 06/11/2013 NEGATIVE  NEGATIVE Final  . Leukocytes, UA 06/11/2013 NEGATIVE  NEGATIVE Final   MICROSCOPIC NOT DONE ON URINES WITH NEGATIVE PROTEIN, BLOOD, LEUKOCYTES, NITRITE, OR GLUCOSE <1000 mg/dL.  Marland Kitchen Glucose-Capillary 06/11/2013 117* 70 - 99 mg/dL Final  . Comment 1 06/11/2013 Documented in Chart   Final  . Glucose-Capillary 06/11/2013 150* 70 - 99 mg/dL Final  . Glucose-Capillary 06/11/2013 160* 70 - 99 mg/dL Final  . WBC 06/12/2013 12.2* 4.0 - 10.5 K/uL Final  . RBC 06/12/2013 3.39* 4.22 - 5.81 MIL/uL Final  . Hemoglobin 06/12/2013 9.2* 13.0 - 17.0 g/dL Final   Comment: REPEATED TO VERIFY                           DELTA CHECK NOTED  . HCT 06/12/2013 27.1* 39.0 - 52.0 %  Final  . MCV 06/12/2013 79.9  78.0 - 100.0 fL Final  . MCH 06/12/2013 27.1  26.0 - 34.0 pg Final  . MCHC 06/12/2013 33.9  30.0 - 36.0 g/dL Final  . RDW 06/12/2013 13.2  11.5 - 15.5 % Final  . Platelets 06/12/2013 181  150 - 400 K/uL Final  . Sodium 06/12/2013 136* 137 - 147 mEq/L Final  . Potassium 06/12/2013 4.6  3.7 - 5.3 mEq/L Final  . Chloride 06/12/2013 100  96 - 112 mEq/L Final  . CO2 06/12/2013 24  19 - 32 mEq/L Final  . Glucose, Bld 06/12/2013 215* 70 - 99 mg/dL Final  . BUN 06/12/2013 28* 6 - 23 mg/dL Final  . Creatinine, Ser 06/12/2013 0.97  0.50 - 1.35 mg/dL Final  . Calcium 06/12/2013 8.5  8.4 - 10.5 mg/dL Final  . GFR calc non Af Amer 06/12/2013 82* >90 mL/min Final  . GFR calc Af Amer 06/12/2013 >90  >90 mL/min Final   Comment: (NOTE)                          The eGFR has been calculated using the CKD EPI equation.                          This calculation has not been validated in all clinical situations.                          eGFR's persistently <90 mL/min signify possible Chronic Kidney                          Disease.  Marland Kitchen Prothrombin Time 06/12/2013 14.4  11.6 - 15.2 seconds Final  . INR 06/12/2013 1.14  0.00 - 1.49 Final  . Glucose-Capillary 06/11/2013 229* 70 - 99 mg/dL Final  . Glucose-Capillary 06/11/2013 100* 70 - 99 mg/dL Final  . Glucose-Capillary 06/12/2013 179* 70 - 99 mg/dL Final  . Glucose-Capillary 06/12/2013 141* 70 - 99 mg/dL Final  . Glucose-Capillary 06/12/2013 200* 70 - 99 mg/dL Final  . Glucose-Capillary 06/12/2013 228* 70 - 99 mg/dL Final  Hospital Outpatient Visit on 06/03/2013  Component Date Value Ref Range Status  . aPTT 06/03/2013 30  24 - 37 seconds Final  . Prothrombin Time 06/03/2013 12.8  11.6 - 15.2 seconds Final  . INR 06/03/2013 0.98  0.00 - 1.49 Final  . ABO/RH(D) 06/03/2013 O POS   Final  . Antibody Screen 06/03/2013 NEG   Final  . Sample Expiration 06/03/2013  06/14/2013   Final  . MRSA, PCR 06/03/2013 NEGATIVE  NEGATIVE Final  . Staphylococcus aureus 06/03/2013 NEGATIVE  NEGATIVE Final   Comment:                                 The Xpert SA Assay (FDA                          approved for NASAL specimens                          in patients over 102 years of age),  is one component of                          a comprehensive surveillance                          program.  Test performance has                          been validated by [facility] for patients greater                          than or equal to 35 year old.                          It is not intended                          to diagnose infection nor to                          guide or monitor treatment.  . ABO/RH(D) 06/03/2013 O POS   Final     X-Rays:No results found.  EKG:No orders found for this or any previous visit.   Hospital Course: Darryl Chang is a 68 y.o. who was admitted to [facility]. They were brought to the operating room on 06/11/2013 and underwent Procedure(s): TOTAL KNEE BILATERAL.  Patient tolerated the procedure well and was later transferred to the recovery room and then to the orthopaedic floor for postoperative care.  They were given PO and IV analgesics for pain control following their surgery.  They were given 24 hours of postoperative antibiotics of  Anti-infectives   Start     Dose/Rate Route Frequency Ordered Stop   06/11/13 2000  ceFAZolin (ANCEF) IVPB 2 g/50 mL premix     2 g 100 mL/hr over 30 Minutes Intravenous Every 6 hours 06/11/13 1804 06/12/13 0240   06/11/13 1103  ceFAZolin (ANCEF) IVPB 2 g/50 mL premix     2 g 100 mL/hr over 30 Minutes Intravenous On call to O.R. 06/11/13 1103 06/11/13 1243     and started on DVT prophylaxis in the form of Lovenox and Coumadin.   PT and OT were ordered for total joint protocol.  Discharge planning consulted to help with postop disposition and  equipment needs.  Patient had a decent night on the evening of surgery.  He started with PT post op day one but was limited by effects of epidural. Anesthesia adjusted dosage as he had essentially no motor or sensory function in LE due to the epidural. Hemovac drain was pulled without difficulty.  Continued to work with therapy into day two.  Dressing was changed on day two and the incision was clean and dry.  Epidural DC and foley catheter DC. Start Lovenox 12 hrs after removal of epidural. Developed ABLA but was symptomatic/ Plan at time of discharge was for patient to continue therapy into post op day three before discharge to CIR. If Hgb dropped below 8, he was to receive 2 units of blood on post op day three.    Discharge Medications: Prior  to Admission medications   Medication Sig Start Date End Date Taking? Authorizing Provider  cholecalciferol (VITAMIN D) 1000 UNITS tablet Take 5,000 Units by mouth every morning.    Yes Historical Provider, MD  docusate sodium 100 MG CAPS Take 100 mg by mouth 2 (two) times daily. 06/11/13   Amber Renelda Loma, PA-C  FLUoxetine (PROZAC) 20 MG tablet Take 20 mg by mouth 2 (two) times daily.   Yes Historical Provider, MD  glipiZIDE (GLUCOTROL) 5 MG tablet Take 10 mg by mouth 2 (two) times daily.    Yes Historical Provider, MD  lisinopril (PRINIVIL,ZESTRIL) 20 MG tablet Take 20 mg by mouth every morning.   Yes Historical Provider, MD  metFORMIN (GLUCOPHAGE) 1000 MG tablet Take 1,000 mg by mouth 2 (two) times daily with a meal.   Yes Historical Provider, MD  methocarbamol (ROBAXIN) 500 MG tablet Take 1 tablet (500 mg total) by mouth every 6 (six) hours as needed for muscle spasms. 06/11/13   Amber Renelda Loma, PA-C  naproxen (NAPROSYN) 500 MG tablet Take 500 mg by mouth 2 (two) times daily with a meal.   Yes Historical Provider, MD  omeprazole (PRILOSEC) 20 MG capsule Take 20 mg by mouth 2 (two) times daily.    Yes Historical Provider, MD  oxyCODONE (OXY  IR/ROXICODONE) 5 MG immediate release tablet Take 1-4 tablets (5-20 mg total) by mouth every 3 (three) hours as needed for breakthrough pain. 06/11/13   Amber Renelda Loma, PA-C  polyethylene glycol (MIRALAX / GLYCOLAX) packet Take 17 g by mouth daily as needed for mild constipation. 06/11/13   Amber Renelda Loma, PA-C  simvastatin (ZOCOR) 20 MG tablet Take 20 mg by mouth at bedtime.   Yes Historical Provider, MD    Diet: Diabetic diet Activity:WBAT Follow-up:in 2 weeks Disposition - CIR Discharged Condition: stable   Discharge Orders   Future Orders Complete By Expires   Call MD / Call 911  As directed    Change dressing  As directed    Constipation Prevention  As directed    Diet Carb Modified  As directed    Discharge instructions  As directed    Do not put a pillow under the knee. Place it under the heel.  As directed    Driving restrictions  As directed    Increase activity slowly as tolerated  As directed        Medication List    STOP taking these medications       aspirin EC 81 MG tablet      TAKE these medications       cholecalciferol 1000 UNITS tablet  Commonly known as:  VITAMIN D  Take 5,000 Units by mouth every morning.     DSS 100 MG Caps  Take 100 mg by mouth 2 (two) times daily.     FLUoxetine 20 MG tablet  Commonly known as:  PROZAC  Take 20 mg by mouth 2 (two) times daily.     glipiZIDE 5 MG tablet  Commonly known as:  GLUCOTROL  Take 10 mg by mouth 2 (two) times daily.     lisinopril 20 MG tablet  Commonly known as:  PRINIVIL,ZESTRIL  Take 20 mg by mouth every morning.     metFORMIN 1000 MG tablet  Commonly known as:  GLUCOPHAGE  Take 1,000 mg by mouth 2 (two) times daily with a meal.     methocarbamol 500 MG tablet  Commonly known as:  ROBAXIN  Take 1 tablet (500 mg  total) by mouth every 6 (six) hours as needed for muscle spasms.     naproxen 500 MG tablet  Commonly known as:  NAPROSYN  Take 500 mg by mouth 2 (two) times daily  with a meal.     omeprazole 20 MG capsule  Commonly known as:  PRILOSEC  Take 20 mg by mouth 2 (two) times daily.     oxyCODONE 5 MG immediate release tablet  Commonly known as:  Oxy IR/ROXICODONE  Take 1-4 tablets (5-20 mg total) by mouth every 3 (three) hours as needed for breakthrough pain.     polyethylene glycol packet  Commonly known as:  MIRALAX / GLYCOLAX  Take 17 g by mouth daily as needed for mild constipation.     simvastatin 20 MG tablet  Commonly known as:  ZOCOR  Take 20 mg by mouth at bedtime.           Follow-up Information   Follow up with Gearlean Alf, MD. Schedule an appointment as soon as possible for a visit in 2 weeks.   Specialty:  Orthopedic Surgery   Contact information:   [address] [place] [address] [phone]       Signed: Malachy Chamber 06/12/2013, 9:50 PM

## 2013-06-12 NOTE — Evaluation (Signed)
Physical Therapy Evaluation Patient Details Name: Darryl Chang MRN: [id number] DOB: [birth year] Today's Date: 06/12/2013   History of Present Illness  BIL tkr  Clinical Impression  Pt s/p Bil TKR presents with functional mobility limitations 2* decreased strength/ROM Bil LEs and inability to feel LEs 2* affect of epidural.  Pt would benefit from follow up rehab at CIR level to maximize IND and safety for return home    Follow Up Recommendations CIR    Equipment Recommendations  None recommended by PT    Recommendations for Other Services OT consult     Precautions / Restrictions Precautions Precautions: Knee;Fall Required Braces or Orthoses: Knee Immobilizer - Right;Knee Immobilizer - Left Knee Immobilizer - Right: Discontinue once straight leg raise with < 10 degree lag Knee Immobilizer - Left: Discontinue once straight leg raise with < 10 degree lag Restrictions Weight Bearing Restrictions: No Other Position/Activity Restrictions: WBAT      Mobility  Bed Mobility Overal bed mobility: +2 for physical assistance;Needs Assistance Bed Mobility: Supine to Sit;Sit to Supine     Supine to sit: +2 for physical assistance;Max assist Sit to supine: +2 for physical assistance;Max assist   General bed mobility comments: cues to sequence,  assist for Bil LEs and to bring trunk to upright  Transfers Overall transfer level: Needs assistance (Standing not attempted 2* affect of epidural on LEs)                  Ambulation/Gait                Stairs            Wheelchair Mobility    Modified Rankin (Stroke Patients Only)       Balance                                             Pertinent Vitals/Pain No pain; epidural in place    Home Living Family/patient expects to be discharged to:: Inpatient rehab Living Arrangements: Spouse/significant other                    Prior Function Level of Independence:  Independent;Independent with assistive device(s)         Comments: Able to walk several hundred feet with cane prior to having to rest     Hand Dominance   Dominant Hand: Right    Extremity/Trunk Assessment   Upper Extremity Assessment: Overall WFL for tasks assessed           Lower Extremity Assessment: RLE deficits/detail;LLE deficits/detail RLE Deficits / Details: 0/5 2* epidural; Knee flex to 90 with no pain LLE Deficits / Details: 0/5 2* epidural; tolerated knee flex to 90 with no pain  Cervical / Trunk Assessment: Normal  Communication   Communication: No difficulties  Cognition Arousal/Alertness: Awake/alert Behavior During Therapy: WFL for tasks assessed/performed Overall Cognitive Status: Within Functional Limits for tasks assessed                      General Comments      Exercises Total Joint Exercises Ankle Circles/Pumps: PROM;10 reps;Supine;Both Heel Slides: PROM;Both;10 reps;Supine      Assessment/Plan    PT Assessment Patient needs continued PT services  PT Diagnosis Difficulty walking   PT Problem List Decreased strength;Decreased range of motion;Decreased activity tolerance;Decreased balance;Decreased mobility;Decreased knowledge of use of DME;Obesity;Pain  PT Treatment Interventions DME instruction;Gait training;Stair training;Functional mobility training;Therapeutic activities;Therapeutic exercise;Patient/family education   PT Goals (Current goals can be found in the Care Plan section) Acute Rehab PT Goals Patient Stated Goal: Resume previous lifestyle with decreased pain PT Goal Formulation: With patient Time For Goal Achievement: 06/19/13 Potential to Achieve Goals: Good    Frequency 7X/week   Barriers to discharge        Co-evaluation               End of Session Equipment Utilized During Treatment: Right knee immobilizer;Left knee immobilizer Activity Tolerance: Patient tolerated treatment well Patient left: in  bed;with call bell/phone within reach;with family/visitor present Nurse Communication: Mobility status         Time: 9604-5409 PT Time Calculation (min): 27 min   Charges:   PT Evaluation $Initial PT Evaluation Tier I: 1 Procedure PT Treatments $Therapeutic Exercise: 8-22 mins $Therapeutic Activity: 8-22 mins   PT G Codes:          Brien Few 06/12/2013, 12:11 PM

## 2013-06-12 NOTE — Progress Notes (Addendum)
   Subjective: 1 Day Post-Op Procedure(s) (LRB): TOTAL KNEE BILATERAL (Bilateral) Patient reports pain as none. Still has no feeling in legs.   We will start therapy today.  Plan is to go Rehab after hospital stay.  Objective: Vital signs in last 24 hours: Temp:  [97.4 F (36.3 C)-98.5 F (36.9 C)] 97.5 F (36.4 C) (04/09 0618) Pulse Rate:  [74-99] 89 (04/09 0618) Resp:  [12-18] 16 (04/09 0618) BP: (111-159)/(54-91) 118/68 mmHg (04/09 0618) SpO2:  [95 %-100 %] 96 % (04/09 0618) Weight:  [255 lb (115.667 kg)] 255 lb (115.667 kg) (04/08 1742)  Intake/Output from previous day:  Intake/Output Summary (Last 24 hours) at 06/12/13 0742 Last data filed at 06/12/13 0600  Gross per 24 hour  Intake 3861.66 ml  Output   1725 ml  Net 2136.66 ml    Intake/Output this shift:    Labs:  Recent Labs  06/11/13 1120 06/12/13 0445  HGB 12.1* 9.2*    Recent Labs  06/11/13 1120 06/12/13 0445  WBC 7.7 12.2*  RBC 4.47 3.39*  HCT 35.8* 27.1*  PLT 209 181    Recent Labs  06/11/13 1120 06/12/13 0445  NA 141 136*  K 4.5 4.6  CL 101 100  CO2 28 24  BUN 25* 28*  CREATININE 1.04 0.97  GLUCOSE 127* 215*  CALCIUM 9.6 8.5    Recent Labs  06/12/13 0445  INR 1.14    EXAM General - Patient is Alert, Appropriate and Oriented Extremity - Neurologically intact No cellulitis present Compartment soft Epidural functioning well. Motor has not returned to BLE yet Dressing - dressing C/D/I Hemovac pulled without difficulty.  Past Medical History  Diagnosis Date  . Diabetes mellitus without complication   . Hyperlipidemia   . Hypertension   . Sleep apnea     USES CPAP SETTING 14  . Arthritis     KNEES AND SHOULDERS  . Anxiety   . Depression   . H/O diverticulitis of colon   . GERD (gastroesophageal reflux disease)     Assessment/Plan: 1 Day Post-Op Procedure(s) (LRB): TOTAL KNEE BILATERAL (Bilateral) Principal Problem:   OA (osteoarthritis) of knee   Advance  diet Up with therapy Begin PT. Ambulate once motor function returns ( will need to decrease epidural dosage)  DVT Prophylaxis - Coumadin.  Weight-Bearing as tolerated to bilateral legs D/C O2 and Pulse OX and try on Room [employer]  Darryl Chang V Darryl Chang 06/12/2013, 7:42 AM

## 2013-06-12 NOTE — Progress Notes (Signed)
ANTICOAGULATION CONSULT NOTE - Initial Consult  Pharmacy Consult for warfarin Indication: VTE prophylaxis  Allergies  Allergen Reactions  . Other     NESTLE CHOCOLATE DRINK - ? COCOA    Patient Measurements: Height: 5\' 9"  (175.3 cm) Weight: 255 lb (115.667 kg) IBW/kg (Calculated) : 70.7 Heparin Dosing Weight:   Vital Signs: Temp: 97.7 F (36.5 C) (04/09 0207) Temp src: Oral (04/09 0207) BP: 111/54 mmHg (04/09 0207) Pulse Rate: 87 (04/09 0207)  Labs:  Recent Labs  06/11/13 1120 06/12/13 0445  HGB 12.1* 9.2*  HCT 35.8* 27.1*  PLT 209 181  LABPROT  --  14.4  INR  --  1.14  CREATININE 1.04 0.97    Estimated Creatinine Clearance: 90.2 ml/min (by C-G formula based on Cr of 0.97).   Medical History: Past Medical History  Diagnosis Date  . Diabetes mellitus without complication   . Hyperlipidemia   . Hypertension   . Sleep apnea     USES CPAP SETTING 14  . Arthritis     KNEES AND SHOULDERS  . Anxiety   . Depression   . H/O diverticulitis of colon   . GERD (gastroesophageal reflux disease)     Medications:  Prescriptions prior to admission  Medication Sig Dispense Refill  . cholecalciferol (VITAMIN D) 1000 UNITS tablet Take 5,000 Units by mouth every morning.       Marland Kitchen FLUoxetine (PROZAC) 20 MG tablet Take 20 mg by mouth 2 (two) times daily.      Marland Kitchen glipiZIDE (GLUCOTROL) 5 MG tablet Take 10 mg by mouth 2 (two) times daily.       Marland Kitchen lisinopril (PRINIVIL,ZESTRIL) 20 MG tablet Take 20 mg by mouth every morning.      . metFORMIN (GLUCOPHAGE) 1000 MG tablet Take 1,000 mg by mouth 2 (two) times daily with a meal.      . naproxen (NAPROSYN) 500 MG tablet Take 500 mg by mouth 2 (two) times daily with a meal.      . omeprazole (PRILOSEC) 20 MG capsule Take 20 mg by mouth 2 (two) times daily.       . simvastatin (ZOCOR) 20 MG tablet Take 20 mg by mouth at bedtime.      . [DISCONTINUED] aspirin EC 81 MG tablet Take 81 mg by mouth at bedtime.       Scheduled:  .  acetaminophen  1,000 mg Oral 4 times per day  . coumadin book  1 each Does not apply Once  . dexamethasone  10 mg Oral Daily   Or  . dexamethasone  10 mg Intravenous Daily  . docusate sodium  100 mg Oral BID  . FLUoxetine  20 mg Oral BID  . glipiZIDE  10 mg Oral BID WC  . insulin aspart  0-15 Units Subcutaneous TID WC  . insulin aspart  0-5 Units Subcutaneous QHS  . metFORMIN  1,000 mg Oral BID WC  . pantoprazole  40 mg Oral BID  . simvastatin  20 mg Oral QHS  . warfarin  4 mg Oral ONCE-1800  . [START ON 06/13/2013] warfarin   Does not apply Once  . Warfarin - Pharmacist Dosing Inpatient   Does not apply q1800    Assessment: Patient s/p TOTAL KNEE BILATERAL (Bilateral) and currently with epidural.  Will begin warfarin POD#1, follow up with removal of epidural.  Goal of Therapy:  INR 2-3    Plan:  Start with Coumadin 4 mg tonight. Check PT/INR daily. Provide Coumadin education.   Jacquenette Shone  [place] Darlina GuysGrimsley Jr. 06/12/2013,6:04 AM

## 2013-06-13 DIAGNOSIS — D62 Acute posthemorrhagic anemia: Secondary | ICD-10-CM | POA: Diagnosis not present

## 2013-06-13 LAB — BASIC METABOLIC PANEL
BUN: 25 mg/dL — ABNORMAL HIGH (ref 6–23)
CALCIUM: 8.7 mg/dL (ref 8.4–10.5)
CO2: 24 meq/L (ref 19–32)
Chloride: 103 mEq/L (ref 96–112)
Creatinine, Ser: 0.8 mg/dL (ref 0.50–1.35)
GFR, EST NON AFRICAN AMERICAN: 89 mL/min — AB (ref >90)
Glucose, Bld: 197 mg/dL — ABNORMAL HIGH (ref 70–99)
Potassium: 4.3 mEq/L (ref 3.7–5.3)
SODIUM: 138 meq/L (ref 137–147)

## 2013-06-13 LAB — PROTIME-INR
INR: 1.09 (ref 0.00–1.49)
Prothrombin Time: 13.9 seconds (ref 11.6–15.2)

## 2013-06-13 LAB — GLUCOSE, CAPILLARY
GLUCOSE-CAPILLARY: 150 mg/dL — AB (ref 70–99)
GLUCOSE-CAPILLARY: 154 mg/dL — AB (ref 70–99)
GLUCOSE-CAPILLARY: 169 mg/dL — AB (ref 70–99)
Glucose-Capillary: 170 mg/dL — ABNORMAL HIGH (ref 70–99)

## 2013-06-13 LAB — CBC
HCT: 23.5 % — ABNORMAL LOW (ref 39.0–52.0)
Hemoglobin: 8.2 g/dL — ABNORMAL LOW (ref 13.0–17.0)
MCH: 27.4 pg (ref 26.0–34.0)
MCHC: 34.9 g/dL (ref 30.0–36.0)
MCV: 78.6 fL (ref 78.0–100.0)
Platelets: 163 10*3/uL (ref 150–400)
RBC: 2.99 MIL/uL — AB (ref 4.22–5.81)
RDW: 13.3 % (ref 11.5–15.5)
WBC: 13 10*3/uL — ABNORMAL HIGH (ref 4.0–10.5)

## 2013-06-13 MED ORDER — WARFARIN SODIUM 7.5 MG PO TABS
7.5000 mg | ORAL_TABLET | Freq: Once | ORAL | Status: DC
  Administered 2013-06-13: 7.5 mg via ORAL
  Filled 2013-06-13: qty 1

## 2013-06-13 MED ORDER — ENOXAPARIN SODIUM 30 MG/0.3ML ~~LOC~~ SOLN
30.0000 mg | Freq: Two times a day (BID) | SUBCUTANEOUS | Status: DC
  Administered 2013-06-13 – 2013-06-16 (×6): 30 mg via SUBCUTANEOUS
  Filled 2013-06-13 (×7): qty 0.3

## 2013-06-13 MED ORDER — HYDROMORPHONE HCL PF 1 MG/ML IJ SOLN
0.5000 mg | INTRAMUSCULAR | Status: DC | PRN
Start: 2013-06-13 — End: 2013-06-16
  Administered 2013-06-13 – 2013-06-14 (×4): 1 mg via INTRAVENOUS
  Filled 2013-06-13 (×5): qty 1

## 2013-06-13 NOTE — Progress Notes (Signed)
Physical Therapy Treatment Patient Details Name: Darryl Chang MRN: [id number] DOB: 02/03/1946 Today's Date: 06/13/2013    History of Present Illness BIL tkr    PT Comments    Pt limited this pm by increased pain since d/c of epidural.    Follow Up Recommendations  CIR     Equipment Recommendations  None recommended by PT    Recommendations for Other Services OT consult     Precautions / Restrictions Precautions Precautions: Knee;Fall Required Braces or Orthoses: Knee Immobilizer - Right;Knee Immobilizer - Left Knee Immobilizer - Right: Discontinue once straight leg raise with < 10 degree lag Knee Immobilizer - Left: Discontinue once straight leg raise with < 10 degree lag Restrictions Weight Bearing Restrictions: No Other Position/Activity Restrictions: WBAT    Mobility  Bed Mobility Overal bed mobility: +2 for physical assistance;Needs Assistance Bed Mobility: Sit to Supine       Sit to supine: Mod assist;+2 for physical assistance   General bed mobility comments: cues for sequence; assist for Bil LEs and guided trunk  Transfers Overall transfer level: Needs assistance Equipment used: Rolling walker (2 wheeled) Transfers: Sit to/from Stand Sit to Stand: Mod assist;+2 physical assistance;From elevated surface         General transfer comment: cues for sequence, LE management and use of UEs to self assist  Ambulation/Gait Ambulation/Gait assistance: Mod assist;+2 physical assistance Ambulation Distance (Feet): 5 Feet (twice) Assistive device: Rolling walker (2 wheeled) Gait Pattern/deviations: Step-to pattern;Decreased step length - right;Decreased step length - left;Trunk flexed;Shuffle Gait velocity: decr   General Gait Details: cues for posture, sequence and position from [employer]            Wheelchair Mobility    Modified Rankin (Stroke Patients Only)       Balance                                    Cognition  Arousal/Alertness: Awake/alert Behavior During Therapy: WFL for tasks assessed/performed Overall Cognitive Status: Within Functional Limits for tasks assessed                      Exercises      General Comments        Pertinent Vitals/Pain 8/10; premed, ice packs provided, RN aware    Home Living Family/patient expects to be discharged to:: Inpatient rehab                    Prior Function Level of Independence: Independent;Independent with assistive device(s)          PT Goals (current goals can now be found in the care plan section) Acute Rehab PT Goals Patient Stated Goal: decreased pain and get back to being independent with adls PT Goal Formulation: With patient Time For Goal Achievement: 06/19/13 Potential to Achieve Goals: Good Progress towards PT goals: Progressing toward goals    Frequency  7X/week    PT Plan Current plan remains appropriate    Co-evaluation PT/OT/SLP Co-Evaluation/Treatment: Yes Reason for Co-Treatment: For patient/therapist safety PT goals addressed during session: Mobility/safety with mobility OT goals addressed during session: ADL's and self-care     End of Session Equipment Utilized During Treatment: Right knee immobilizer;Left knee immobilizer Activity Tolerance: Patient tolerated treatment well Patient left: in bed;with call bell/phone within reach     Time: 1445-1535 PT Time Calculation (min): 50 min  Charges:  $Gait  Training: 8-22 mins                    G Codes:      Darryl Chang 06/13/2013, 5:26 PM

## 2013-06-13 NOTE — H&P (Signed)
Physical Medicine and Rehabilitation Admission H&P    No chief complaint on file. :  Chief complaint> knee pain  HPI: Darryl Chang is a 68 y.o. right-handed male admitted 06/11/2013 with progressive bilateral knee pain left greater than right and no relief with conservative care. Underwent bilateral total knee arthroplasties 06/11/2013 per Dr.Alusio. Postoperative pain management with epidural removed 06/13/2013. Coumadin for DVT prophylaxis. Weightbearing as tolerated bilateral lower extremities. Acute blood loss anemia 7.9 and transfused. Bouts of constipation with mild nausea. A KUB completed 06/16/2013 was negative. Physical and occupational therapy evaluations completed an ongoing with recommendations of physical medicine rehabilitation consult. M.D. has requested physical medicine rehabilitation consult. Patient was admitted for comprehensive rehabilitation program   ROS Review of Systems  Gastrointestinal:  GERD  Musculoskeletal: Positive for joint pain and myalgias.  Psychiatric/Behavioral: Positive for depression.  Anxiety  All other systems reviewed and are negative  Past Medical History  Diagnosis Date  . Diabetes mellitus without complication   . Hyperlipidemia   . Hypertension   . Sleep apnea     USES CPAP SETTING 14  . Arthritis     KNEES AND SHOULDERS  . Anxiety   . Depression   . H/O diverticulitis of colon   . GERD (gastroesophageal reflux disease)    Past Surgical History  Procedure Laterality Date  . Tonsillectomy    . Vasectomy    . Eye surgery      BILATERAL CATARACT EXTRACTION AND LENS IMPLANTS  . Total knee arthroplasty Bilateral 06/11/2013    Procedure: TOTAL KNEE BILATERAL;  Surgeon: Gearlean Alf, MD;  Location: [facility];  Service: Orthopedics;  Laterality: Bilateral;   History reviewed. No pertinent family history. Social History:  reports that he has quit smoking. His smoking use included Cigarettes. He has a 15 pack-year smoking history.  He does not have any smokeless tobacco history on file. He reports that he does not drink alcohol or use illicit drugs. Allergies:  Allergies  Allergen Reactions  . Other     NESTLE CHOCOLATE DRINK - ? COCOA   Medications Prior to Admission  Medication Sig Dispense Refill  . cholecalciferol (VITAMIN D) 1000 UNITS tablet Take 5,000 Units by mouth every morning.       Marland Kitchen FLUoxetine (PROZAC) 20 MG tablet Take 20 mg by mouth 2 (two) times daily.      Marland Kitchen glipiZIDE (GLUCOTROL) 5 MG tablet Take 10 mg by mouth 2 (two) times daily.       Marland Kitchen lisinopril (PRINIVIL,ZESTRIL) 20 MG tablet Take 20 mg by mouth every morning.      . metFORMIN (GLUCOPHAGE) 1000 MG tablet Take 1,000 mg by mouth 2 (two) times daily with a meal.      . naproxen (NAPROSYN) 500 MG tablet Take 500 mg by mouth 2 (two) times daily with a meal.      . omeprazole (PRILOSEC) 20 MG capsule Take 20 mg by mouth 2 (two) times daily.       . simvastatin (ZOCOR) 20 MG tablet Take 20 mg by mouth at bedtime.      . [DISCONTINUED] aspirin EC 81 MG tablet Take 81 mg by mouth at bedtime.        Home: Home Living Family/patient expects to be discharged to:: Inpatient rehab Living Arrangements: Spouse/significant other   Functional History: Prior Function Comments: Able to walk several hundred feet with cane prior to having to rest  Functional Status:  Mobility:     Ambulation/Gait Ambulation Distance (  Feet): 28 Feet Gait velocity: decr General Gait Details: cues for posture, sequence and position from RW    ADL:    Cognition: Cognition Overall Cognitive Status: Within Functional Limits for tasks assessed Orientation Level: Oriented X4 Cognition Arousal/Alertness: Awake/alert Behavior During Therapy: WFL for tasks assessed/performed Overall Cognitive Status: Within Functional Limits for tasks assessed  Physical Exam: Blood pressure 159/71, pulse 83, temperature 98.4 F (36.9 C), temperature source Oral, resp. rate 16, height  5' 9"  (1.753 m), weight 255 lb (115.667 kg), SpO2 99.00%. Physical Exam Constitutional: He is oriented to person, place, and time. He appears well-developed.  HENT: oral mucosa pink and moist. Dentition good Head: Normocephalic.  Eyes: EOM are normal.  Neck: Normal range of motion. Neck supple. No thyromegaly present.  Cardiovascular: Normal rate and regular rhythm. No murmurs, rubs, or gallops Respiratory: Effort normal and breath sounds normal. No respiratory distress. No wheezes, rales, or rhonchi GI:  . Bowel sounds are normal. He exhibits mild distension. Non-tender    Skin:  Bilateral knee incisions clean and dry and appropriately tender. Mild edema surrounding area extremities show 1+ pedal edema.  Normal dorsalis pedis pulses  Neuro: alert and oriented. CN nerves normal. sensation is decreased to light touch and proprioception in both feet  Upper extremity strength is 5/5 bilateral deltoid, bicep, tricep, grip  Lower ext strength is 0 in the hip flexors knee extensors, 4/5 ankle dorsiflexors plantar flexors  Results for orders placed during the hospital encounter of 06/11/13 (from the past 48 hour(s))  GLUCOSE, CAPILLARY     Status: Abnormal   Collection Time    06/11/13  1:24 PM      Result Value Ref Range   Glucose-Capillary 100 (*) 70 - 99 mg/dL  GLUCOSE, CAPILLARY     Status: Abnormal   Collection Time    06/11/13  3:32 PM      Result Value Ref Range   Glucose-Capillary 150 (*) 70 - 99 mg/dL  GLUCOSE, CAPILLARY     Status: Abnormal   Collection Time    06/11/13  6:01 PM      Result Value Ref Range   Glucose-Capillary 160 (*) 70 - 99 mg/dL  GLUCOSE, CAPILLARY     Status: Abnormal   Collection Time    06/11/13 10:09 PM      Result Value Ref Range   Glucose-Capillary 229 (*) 70 - 99 mg/dL  CBC     Status: Abnormal   Collection Time    06/12/13  4:45 AM      Result Value Ref Range   WBC 12.2 (*) 4.0 - 10.5 K/uL   RBC 3.39 (*) 4.22 - 5.81 MIL/uL   Hemoglobin 9.2  (*) 13.0 - 17.0 g/dL   Comment: REPEATED TO VERIFY     DELTA CHECK NOTED   HCT 27.1 (*) 39.0 - 52.0 %   MCV 79.9  78.0 - 100.0 fL   MCH 27.1  26.0 - 34.0 pg   MCHC 33.9  30.0 - 36.0 g/dL   RDW 13.2  11.5 - 15.5 %   Platelets 181  150 - 400 K/uL  BASIC METABOLIC PANEL     Status: Abnormal   Collection Time    06/12/13  4:45 AM      Result Value Ref Range   Sodium 136 (*) 137 - 147 mEq/L   Potassium 4.6  3.7 - 5.3 mEq/L   Chloride 100  96 - 112 mEq/L   CO2 24  19 - 32  mEq/L   Glucose, Bld 215 (*) 70 - 99 mg/dL   BUN 28 (*) 6 - 23 mg/dL   Creatinine, Ser 0.97  0.50 - 1.35 mg/dL   Calcium 8.5  8.4 - 10.5 mg/dL   GFR calc non Af Amer 82 (*) >90 mL/min   GFR calc Af Amer >90  >90 mL/min   Comment: (NOTE)     The eGFR has been calculated using the CKD EPI equation.     This calculation has not been validated in all clinical situations.     eGFR's persistently <90 mL/min signify possible Chronic Kidney     Disease.  PROTIME-INR     Status: None   Collection Time    06/12/13  4:45 AM      Result Value Ref Range   Prothrombin Time 14.4  11.6 - 15.2 seconds   INR 1.14  0.00 - 1.49  GLUCOSE, CAPILLARY     Status: Abnormal   Collection Time    06/12/13  7:17 AM      Result Value Ref Range   Glucose-Capillary 179 (*) 70 - 99 mg/dL  GLUCOSE, CAPILLARY     Status: Abnormal   Collection Time    06/12/13 11:51 AM      Result Value Ref Range   Glucose-Capillary 141 (*) 70 - 99 mg/dL  GLUCOSE, CAPILLARY     Status: Abnormal   Collection Time    06/12/13  4:17 PM      Result Value Ref Range   Glucose-Capillary 200 (*) 70 - 99 mg/dL  GLUCOSE, CAPILLARY     Status: Abnormal   Collection Time    06/12/13  9:21 PM      Result Value Ref Range   Glucose-Capillary 228 (*) 70 - 99 mg/dL  CBC     Status: Abnormal   Collection Time    06/13/13  5:10 AM      Result Value Ref Range   WBC 13.0 (*) 4.0 - 10.5 K/uL   RBC 2.99 (*) 4.22 - 5.81 MIL/uL   Hemoglobin 8.2 (*) 13.0 - 17.0 g/dL   HCT  23.5 (*) 39.0 - 52.0 %   MCV 78.6  78.0 - 100.0 fL   MCH 27.4  26.0 - 34.0 pg   MCHC 34.9  30.0 - 36.0 g/dL   RDW 13.3  11.5 - 15.5 %   Platelets 163  150 - 400 K/uL  BASIC METABOLIC PANEL     Status: Abnormal   Collection Time    06/13/13  5:10 AM      Result Value Ref Range   Sodium 138  137 - 147 mEq/L   Potassium 4.3  3.7 - 5.3 mEq/L   Chloride 103  96 - 112 mEq/L   CO2 24  19 - 32 mEq/L   Glucose, Bld 197 (*) 70 - 99 mg/dL   BUN 25 (*) 6 - 23 mg/dL   Creatinine, Ser 0.80  0.50 - 1.35 mg/dL   Calcium 8.7  8.4 - 10.5 mg/dL   GFR calc non Af Amer 89 (*) >90 mL/min   GFR calc Af Amer >90  >90 mL/min   Comment: (NOTE)     The eGFR has been calculated using the CKD EPI equation.     This calculation has not been validated in all clinical situations.     eGFR's persistently <90 mL/min signify possible Chronic Kidney     Disease.  PROTIME-INR     Status: None  Collection Time    06/13/13  5:10 AM      Result Value Ref Range   Prothrombin Time 13.9  11.6 - 15.2 seconds   INR 1.09  0.00 - 1.49  GLUCOSE, CAPILLARY     Status: Abnormal   Collection Time    06/13/13  7:32 AM      Result Value Ref Range   Glucose-Capillary 150 (*) 70 - 99 mg/dL   Comment 1 Documented in Chart     Comment 2 Notify RN    GLUCOSE, CAPILLARY     Status: Abnormal   Collection Time    06/13/13 11:49 AM      Result Value Ref Range   Glucose-Capillary 154 (*) 70 - 99 mg/dL   Comment 1 Documented in Chart     Comment 2 Notify RN     No results found.  Post Admission Physician Evaluation: 1. 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 2. Patient is admitted to receive collaborative, interdisciplinary care between the physiatrist, rehab nursing staff, and therapy team. 3. Patient's level of medical complexity and substantial therapy needs in context of that medical necessity cannot be provided at a lesser intensity of care such as a SNF. 4. Patient has experienced  substantial functional loss from his/her baseline which was documented above under the "Functional History" and "Functional Status" headings.  Judging by the patient's diagnosis, physical exam, and functional history, the patient has potential for functional progress which will result in measurable gains while on inpatient rehab.  These gains will be of substantial and practical use upon discharge  in facilitating mobility and self-care at the household level. 5. Physiatrist will provide 24 hour management of medical needs as well as oversight of the therapy plan/treatment and provide guidance as appropriate regarding the interaction of the two. 6. 24 hour rehab nursing will assist with bladder management, bowel management, safety, skin/wound care, disease management, medication administration, pain management and patient education  and help integrate therapy concepts, techniques,education, etc. 7. PT will assess and treat for/with: Lower extremity strength, range of motion, stamina, balance, functional mobility, safety, adaptive techniques and equipment, knee ROM, knee pain and strength, ortho precautions, .   Goals are: mod I. 8. OT will assess and treat for/with: ADL's, functional mobility, safety, upper extremity strength, adaptive techniques and equipment, pain mgt, ortho precautions, education.   Goals are: mod I. 9. SLP will assess and treat for/with: n/a.  Goals are: n/a. 10. Case Management and Social Worker will assess and treat for psychological issues and discharge planning. 11. Team conference will be held weekly to assess progress toward goals and to determine barriers to discharge. 12. Patient will receive at least 3 hours of therapy per day at least 5 days per week. 13. ELOS: 7 days       14. 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 DVT Prophylaxis/Anticoagulation: Coumadin for DVT prophylaxis. Continue Lovenox  until INR greater than 2.00. Monitor for any bleeding episodes. Check vascular study on admit 3. Pain Management: Oxycodone Robaxin as needed. Monitor with increased mobility  4. Mood/depression: Prozac 20 mg twice a day. Provide emotional support 5. Neuropsych: This patient is capable of making decisions on his own behalf. 6. Acute blood loss anemia. Followup CBC 7. Diabetes mellitus with peripheral neuropathy. Glucophage 1000 mg twice a day, Glucotrol 10 mg twice a day. Check blood sugars a.c. and at  bedtime 8. Hyperlipidemia. Zocor 9. Hypertension. Currently on no antihypertensive medication. Patient on lisinopril 20 mg daily prior to admission. Monitor with increased mobility 10. Sleep apnea. CPAP 11.constipation. Adjust bowel meds/KUB negative for ileus------will need to stay aggressive with bowel meds however as he still has stool in his gut on today's KUB.    Meredith Staggers, MD, [employer] [facility]  06/13/2013

## 2013-06-13 NOTE — Progress Notes (Addendum)
Physical Therapy Treatment Patient Details Name: Darryl Chang MRN: [id number] DOB: 06-Aug-1945 Today's Date: 06/13/2013    History of Present Illness BIL tkr    PT Comments    Pt extremely motivated and with increasing motor return Bil LEs but with min discomfort experienced at this time  Follow Up Recommendations  CIR     Equipment Recommendations  None recommended by PT    Recommendations for Other Services OT consult     Precautions / Restrictions Precautions Precautions: Knee;Fall Required Braces or Orthoses: Knee Immobilizer - Right;Knee Immobilizer - Left Knee Immobilizer - Right: Discontinue once straight leg raise with < 10 degree lag Knee Immobilizer - Left: Discontinue once straight leg raise with < 10 degree lag Restrictions Weight Bearing Restrictions: No Other Position/Activity Restrictions: WBAT    Mobility  Bed Mobility Overal bed mobility: +2 for physical assistance;Needs Assistance Bed Mobility: Supine to Sit     Supine to sit: Mod assist;+2 for physical assistance     General bed mobility comments: cues to sequence,  assist for Bil LEs and to bring trunk to upright  Transfers Overall transfer level: Needs assistance Equipment used: Rolling walker (2 wheeled) Transfers: Sit to/from Stand Sit to Stand: Mod assist;+2 physical assistance;From elevated surface         General transfer comment: cues for sequence, LE management and use of UEs to self assist  Ambulation/Gait Ambulation/Gait assistance: +2 physical assistance;Mod assist Ambulation Distance (Feet): 28 Feet Assistive device: Rolling walker (2 wheeled) Gait Pattern/deviations: Step-to pattern;Decreased step length - right;Decreased step length - left;Shuffle;Trunk flexed Gait velocity: decr   General Gait Details: cues for posture, sequence and position from [employer] Mobility    Modified Rankin (Stroke Patients Only)       Balance                                     Cognition Arousal/Alertness: Awake/alert Behavior During Therapy: WFL for tasks assessed/performed Overall Cognitive Status: Within Functional Limits for tasks assessed                      Exercises Total Joint Exercises Ankle Circles/Pumps: Supine;Both;AAROM;20 reps Quad Sets: AROM;AAROM;Both;20 reps;Supine Heel Slides: Both;Supine;AAROM;20 reps Straight Leg Raises: AAROM;Both;20 reps;Supine Goniometric ROM: Bil knees AAROM -0 - 100    General Comments        Pertinent Vitals/Pain No pain    Home Living                      Prior Function            PT Goals (current goals can now be found in the care plan section) Acute Rehab PT Goals Patient Stated Goal: Resume previous lifestyle with decreased pain PT Goal Formulation: With patient Time For Goal Achievement: 06/19/13 Potential to Achieve Goals: Good Progress towards PT goals: Progressing toward goals    Frequency  7X/week    PT Plan Current plan remains appropriate    Co-evaluation             End of Session Equipment Utilized During Treatment: Right knee immobilizer;Left knee immobilizer Activity Tolerance: Patient tolerated treatment well Patient left: in chair;with call bell/phone within reach;with nursing/sitter in room     Time: 1103-1126  PT Time Calculation (min): 35 min  Charges:  $  Gait Training: 23-37 mins $Therapeutic Exercise: 23-37 mins                    G Codes:      Brien FewHunter P Safiyah Cisney 06/13/2013, 11:56 AM

## 2013-06-13 NOTE — Progress Notes (Signed)
   Subjective: 2 Days Post-Op Procedure(s) (LRB): TOTAL KNEE BILATERAL (Bilateral) Patient reports pain as mild.   Plan is to go Rehab after hospital stay.  Objective: Vital signs in last 24 hours: Temp:  [98 F (36.7 C)-98.9 F (37.2 C)] 98.4 F (36.9 C) (04/10 0650) Pulse Rate:  [76-95] 83 (04/10 0650) Resp:  [14-18] 16 (04/10 0650) BP: (125-159)/(62-83) 159/71 mmHg (04/10 0650) SpO2:  [95 %-98 %] 97 % (04/10 0650)  Intake/Output from previous day:  Intake/Output Summary (Last 24 hours) at 06/13/13 0759 Last data filed at 06/13/13 0700  Gross per 24 hour  Intake 2214.66 ml  Output   2970 ml  Net -755.34 ml    Intake/Output this shift:    Labs:  Recent Labs  06/11/13 1120 06/12/13 0445 06/13/13 0510  HGB 12.1* 9.2* 8.2*    Recent Labs  06/12/13 0445 06/13/13 0510  WBC 12.2* 13.0*  RBC 3.39* 2.99*  HCT 27.1* 23.5*  PLT 181 163    Recent Labs  06/12/13 0445 06/13/13 0510  NA 136* 138  K 4.6 4.3  CL 100 103  CO2 24 24  BUN 28* 25*  CREATININE 0.97 0.80  GLUCOSE 215* 197*  CALCIUM 8.5 8.7    Recent Labs  06/12/13 0445 06/13/13 0510  INR 1.14 1.09    EXAM General - Patient is Alert, Appropriate and Oriented Extremity - Neurologically intact Neurovascular intact No cellulitis present Compartment soft Dressing/Incision - clean, dry, no drainage Motor Function - intact, moving foot and toes well on exam.   Past Medical History  Diagnosis Date  . Diabetes mellitus without complication   . Hyperlipidemia   . Hypertension   . Sleep apnea     USES CPAP SETTING 14  . Arthritis     KNEES AND SHOULDERS  . Anxiety   . Depression   . H/O diverticulitis of colon   . GERD (gastroesophageal reflux disease)     Assessment/Plan: 2 Days Post-Op Procedure(s) (LRB): TOTAL KNEE BILATERAL (Bilateral) Principal Problem:   OA (osteoarthritis) of knee   Advance diet Up with therapy Epidural out today Start Lovenox 30 mg SubQ bid. 1st dose 12  hours after epidural is pulled Post-op blood loss anemia- Follow hemoglobin. Transfuse if he decreases below 7.5 or develops symptomatic anemia Discontinue Foley 6 hours after epidural is out.  DVT Prophylaxis - Coumadin and Lovenox Weight-Bearing as tolerated to bilateral legs  Gus RankinFrank V Skyleen Bentley 06/13/2013, 7:59 AM

## 2013-06-13 NOTE — Progress Notes (Signed)
Physical Therapy Treatment Patient Details Name: Darryl BandyRichard Chang MRN: [id number] DOB: Jan 20, 1946 Today's Date: 06/13/2013    History of Present Illness BIL tkr    PT Comments    Pt with return of motor function but with continued absence of sensation.  Therex performed this am but OOB deferred until affects of epidural diminished - pulled by anesthesiology during session  Follow Up Recommendations  CIR     Equipment Recommendations  None recommended by PT    Recommendations for Other Services OT consult     Precautions / Restrictions Precautions Precautions: Knee;Fall Required Braces or Orthoses: Knee Immobilizer - Right;Knee Immobilizer - Left Knee Immobilizer - Right: Discontinue once straight leg raise with < 10 degree lag Knee Immobilizer - Left: Discontinue once straight leg raise with < 10 degree lag Restrictions Weight Bearing Restrictions: No Other Position/Activity Restrictions: WBAT    Mobility  Bed Mobility Overal bed mobility:  (Deferred to after epidural d/c)                Transfers                    Ambulation/Gait                 Stairs            Wheelchair Mobility    Modified Rankin (Stroke Patients Only)       Balance                                    Cognition Arousal/Alertness: Awake/alert Behavior During Therapy: WFL for tasks assessed/performed Overall Cognitive Status: Within Functional Limits for tasks assessed                      Exercises Total Joint Exercises Ankle Circles/Pumps: Supine;Both;AAROM;20 reps Quad Sets: AROM;AAROM;Both;20 reps;Supine Heel Slides: Both;Supine;AAROM;20 reps Straight Leg Raises: AAROM;Both;20 reps;Supine Goniometric ROM: Bil knees AAROM -0 - 100    General Comments        Pertinent Vitals/Pain No pain    Home Living                      Prior Function            PT Goals (current goals can now be found in the care plan  section) Acute Rehab PT Goals Patient Stated Goal: Resume previous lifestyle with decreased pain PT Goal Formulation: With patient Time For Goal Achievement: 06/19/13 Potential to Achieve Goals: Good Progress towards PT goals: Progressing toward goals    Frequency  7X/week    PT Plan Current plan remains appropriate    Co-evaluation             End of Session Equipment Utilized During Treatment: Right knee immobilizer;Left knee immobilizer Activity Tolerance: Patient tolerated treatment well Patient left: in bed;with call bell/phone within reach;with family/visitor present     Time: [id number] PT Time Calculation (min): 25 min  Charges:  $Therapeutic Exercise: 23-37 mins                    G Codes:      Brien FewHunter P Dejane Scheibe 06/13/2013, 9:34 AM

## 2013-06-13 NOTE — PMR Pre-admission (Addendum)
PMR Admission Coordinator Pre-Admission Assessment  Patient: Darryl BandyRichard Chang is an 68 y.o., male MRN: [id number] DOB: 08-21-1945 Height: 5\' 9"  (175.3 cm) Weight: 115.667 kg (255 lb)              Insurance Information HMO:  No    PPO:       PCP:       IPA:       80/20:       OTHER:   PRIMARY: Medicare A/B      Policy#: [id number]      Subscriber: Darryl Chang CM Name:        Phone#:       Fax#:   Pre-Cert#:        Employer: Retired Benefits:  Phone #:       Name: [place]ed in [place] Date: 08/04/08     Deduct: $1260      Out of Pocket Max: none      Life Max: unlimited CIR: 100%      SNF: 100 days Outpatient: 80%     Co-Pay: 20% Home Health: 100%      Co-Pay: none DME: 80%     Co-Pay: 20% Providers: patient's choice  SECONDARY: [employer]      Policy#: [id number]      Subscriber: Darryl Chang CM Name:        Phone#:       Fax#:   Pre-Cert#:        Employer: Retired Benefits:  Phone #: [phone]     Name:   Eff. Date:       Deduct:        Out of Pocket Max:        Life Max:   CIR:        SNF:   Outpatient:       Co-Pay:   Home Health:        Co-Pay:   DME:       Co-Pay:     Emergency Contact Information Contact Information   Name Relation Home Work Mobile   [place],Diane Spouse [phone]  [phone]     Current Medical History  Patient Admitting Diagnosis:  B TKR  History of Present Illness:  A 68 y.o. right-handed male admitted 06/11/2013 with progressive bilateral knee pain left greater than right and no relief with conservative care. Underwent bilateral total knee arthroplasties 06/11/2013 per Dr.Alusio. Postoperative pain management with epidural removed 06/13/2013. Coumadin for DVT prophylaxis. Weightbearing as tolerated bilateral lower extremities. Acute blood loss anemia 7.9 and transfused. Bouts of constipation with mild nausea. A KUB completed 06/16/2013 was negative. Physical and occupational therapy evaluations completed an ongoing with  recommendations of physical medicine rehabilitation consult. M.D. has requested physical medicine rehabilitation consult. Patient will be admitted for comprehensive rehabilitation program.   Past Medical History  Past Medical History  Diagnosis Date  . Diabetes mellitus without complication   . Hyperlipidemia   . Hypertension   . Sleep apnea     USES CPAP SETTING 14  . Arthritis     KNEES AND SHOULDERS  . Anxiety   . Depression   . H/O diverticulitis of colon   . GERD (gastroesophageal reflux disease)     Family History  family history is not on file.  Prior Rehab/Hospitalizations: None   Current Medications  Current facility-administered medications:0.9 %  sodium chloride infusion, , Intravenous, Continuous, Loanne DrillingFrank V [name], MD, Last Rate: 20 mL/hr at 06/13/13 0934;  acetaminophen (TYLENOL) suppository 650 mg,  650 mg, Rectal, Q6H PRN, Loanne Drilling, MD;  acetaminophen (TYLENOL) tablet 650 mg, 650 mg, Oral, Q6H PRN, Loanne Drilling, MD, 650 mg at 06/16/13 0810 alum & mag hydroxide-simeth (MAALOX/MYLANTA) 200-200-20 MG/5ML suspension 30 mL, 30 mL, Oral, Q4H PRN, Amber Lauren Constable, PA-C, 30 mL at 06/16/13 1344;  bisacodyl (DULCOLAX) suppository 10 mg, 10 mg, Rectal, Daily PRN, Loanne Drilling, MD, 10 mg at 06/15/13 2122;  diphenhydrAMINE (BENADRYL) 12.5 MG/5ML elixir 12.5-25 mg, 12.5-25 mg, Oral, Q4H PRN, Loanne Drilling, MD docusate sodium (COLACE) capsule 100 mg, 100 mg, Oral, BID, Loanne Drilling, MD, 100 mg at 06/15/13 2122;  enoxaparin (LOVENOX) injection 30 mg, 30 mg, Subcutaneous, Q12H, Loanne Drilling, MD, 30 mg at 06/16/13 0900;  FLUoxetine (PROZAC) tablet 20 mg, 20 mg, Oral, BID, Loanne Drilling, MD, 20 mg at 06/16/13 0900;  glipiZIDE (GLUCOTROL) tablet 10 mg, 10 mg, Oral, BID WC, Loanne Drilling, MD, 10 mg at 06/16/13 0810 HYDROmorphone (DILAUDID) injection 0.5-1 mg, 0.5-1 mg, Intravenous, Q1H PRN, Loanne Drilling, MD, 1 mg at 06/14/13 0622;  insulin aspart (novoLOG)  injection 0-15 Units, 0-15 Units, Subcutaneous, TID WC, Loanne Drilling, MD, 2 Units at 06/16/13 1258;  insulin aspart (novoLOG) injection 0-5 Units, 0-5 Units, Subcutaneous, QHS, Jaclyn M. Christene Lye, PA-C, 2 Units at 06/12/13 2148 menthol-cetylpyridinium (CEPACOL) lozenge 3 mg, 1 lozenge, Oral, PRN, Loanne Drilling, MD;  metFORMIN (GLUCOPHAGE) tablet 1,000 mg, 1,000 mg, Oral, BID WC, Loanne Drilling, MD, 1,000 mg at 06/16/13 0809;  methocarbamol (ROBAXIN) 500 mg in dextrose 5 % 50 mL IVPB, 500 mg, Intravenous, Q6H PRN, Loanne Drilling, MD, 500 mg at 06/11/13 1633;  methocarbamol (ROBAXIN) tablet 500 mg, 500 mg, Oral, Q6H PRN, Loanne Drilling, MD, 500 mg at 06/16/13 0810 metoCLOPramide (REGLAN) injection 5-10 mg, 5-10 mg, Intravenous, Q8H PRN, Loanne Drilling, MD;  metoCLOPramide (REGLAN) tablet 5-10 mg, 5-10 mg, Oral, Q8H PRN, Loanne Drilling, MD;  ondansetron [facility]) injection 4 mg, 4 mg, Intravenous, Q6H PRN, Loanne Drilling, MD, 4 mg at 06/13/13 1348;  ondansetron (ZOFRAN) tablet 4 mg, 4 mg, Oral, Q6H PRN, Loanne Drilling, MD, 4 mg at 06/15/13 1744 oxyCODONE (Oxy IR/ROXICODONE) immediate release tablet 5-20 mg, 5-20 mg, Oral, Q3H PRN, Loanne Drilling, MD, 20 mg at 06/16/13 1157;  pantoprazole (PROTONIX) EC tablet 40 mg, 40 mg, Oral, BID, Loanne Drilling, MD, 40 mg at 06/16/13 0900;  phenol (CHLORASEPTIC) mouth spray 1 spray, 1 spray, Mouth/Throat, PRN, Loanne Drilling, MD;  polyethylene glycol (MIRALAX / GLYCOLAX) packet 17 g, 17 g, Oral, Daily PRN, Loanne Drilling, MD, 17 g at 06/15/13 1637 simvastatin (ZOCOR) tablet 20 mg, 20 mg, Oral, QHS, Loanne Drilling, MD, 20 mg at 06/15/13 2122;  warfarin (COUMADIN) tablet 5 mg, 5 mg, Oral, ONCE-1800, Randall K Absher, RPH;  Warfarin - Pharmacist Dosing Inpatient, , Does not apply, q1800, Loanne Drilling, MD  Patients Current Diet: Carb Control  Precautions / Restrictions Precautions Precautions: Knee;Fall Restrictions Weight Bearing Restrictions: No Other  Position/Activity Restrictions: WBAT   Prior Activity Level Community (5-7x/wk): Went out daily.  Works PT Marketing executive.  Home Assistive Devices / Equipment Home Assistive Devices/Equipment: Cane (specify quad or straight);Eyeglasses;Dentures (specify type);CBG Meter;CPAP  Prior Functional Level Prior Function Level of Independence: Independent;Independent with assistive device(s) Comments: Able to walk several hundred feet with cane prior to having to rest  Current Functional Level Cognition  Overall Cognitive Status: Within Functional Limits for tasks assessed  Orientation Level: Oriented X4    Extremity Assessment (includes Sensation/Coordination)  Upper Extremity Assessment: Overall WFL for tasks assessed  Lower Extremity Assessment: RLE deficits/detail;LLE deficits/detail  RLE Deficits / Details: 0/5 2* epidural; Knee flex to 90 with no pain  LLE Deficits / Details: 0/5 2* epidural; tolerated knee flex to 90 with no pain  Cervical / Trunk Assessment: Normal    ADLs  OT evaluation pending, anticipate ADL needs    Mobility  Overal bed mobility:  (Deferred to after first unit of blood) Bed Mobility: Supine to Sit Supine to sit: Mod assist;+2 for physical assistance Sit to supine: +2 for physical assistance;Mod assist;HOB elevated General bed mobility comments: cues and assist for bil LE's and for lifting trunk upright    Transfers  Overall transfer level: Needs assistance Equipment used: Rolling walker (2 wheeled) Transfers: Sit to/from Stand Sit to Stand: +2 physical assistance;+2 safety/equipment;Max assist General transfer comment: assist for lifting off elevated seating surface; cues for technique and UE use to assist    Ambulation / Gait / Stairs / Wheelchair Mobility  Ambulation/Gait Ambulation/Gait assistance: +2 safety/equipment;Mod assist Ambulation Distance (Feet):  (then 10' to [facility] Assistive device: Rolling walker (2 wheeled) Gait  Pattern/deviations: Step-to pattern;Antalgic;Decreased stride length Gait velocity: decr General Gait Details: cues for weight shift, for sequence and posture, stops for rest x 3, noted dyspnea and sweaty    Posture / Balance      Special needs/care consideration BiPAP/CPAP Yes, CPAP at the bedside and at home. CPM Yes, bilateral Continuous Drip IV No Dialysis No       Life Vest No Oxygen No Special Bed No Trach Size No Wound Vac (area) No      Skin Has bilateral knee incisions                               Bowel mgmt: No BM as of 06/13/13 Bladder mgmt: Foley catheter on 06/13/13 Diabetic mgmt Yes, on oral medications at home.    Previous Home Environment Living Arrangements: Spouse/significant other Home Care Services: No  Discharge Living Setting Plans for Discharge Living Setting: Patient's home;House;Lives with (comment) (Lives with wife.) Type of Home at Discharge: House Discharge Home Layout: One level (Has a basement, but does not need to use.) Discharge Home Access: Stairs to enter Entrance Stairs-Number of Steps: 2 Does the patient have any problems obtaining your medications?: No  Social/Family/Support Systems Patient Roles: Spouse;Parent (Has a wife, 2 sons, and 1 Dtr.) Contact Information: Jeral Zick - wife Anticipated Caregiver: wife Anticipated Caregiver's Contact Information: Diane - (h) [phone] (c) [phone] Ability/Limitations of Caregiver: Wife retired and can provide Psychologist, clinical Availability: 24/7 Discharge Plan Discussed with Primary Caregiver: Yes Is Caregiver In Agreement with Plan?: Yes Does Caregiver/Family have Issues with Lodging/Transportation while Pt is in Rehab?: No  Goals/Additional Needs Patient/Family Goal for Rehab: PT/OT mod I goals Expected length of stay: 5-7 days Cultural Considerations: None Dietary Needs: Carb mod med cal, thin liquids Equipment Needs: TBD Pt/Family Agrees to Admission and willing to  participate: Yes Program Orientation Provided & Reviewed with Pt/Caregiver Including Roles  & Responsibilities: Yes  Decrease burden of Care through IP rehab admission: N/A  Possible need for SNF placement upon discharge: Not planned  Patient Condition: This patient's medical and functional status has changed since the consult dated: 06/12/13 in which the Rehabilitation Physician determined and documented that the patient's condition is appropriate for intensive rehabilitative care  in an inpatient rehabilitation facility. See "History of Present Illness" (above) for medical update. Functional changes are: Currently requiring Mod Assist +2  28 feet RW. Patient's medical and functional status update has been discussed with the Rehabilitation physician and patient remains appropriate for inpatient rehabilitation. Will admit to inpatient rehab today.  Preadmission Screen Completed By:  Trish Mage, 06/16/2013 3:07 PM ______________________________________________________________________   Discussed status with Dr. Riley Kill on 06/16/13 at 1508 and received telephone approval for admission today.  Admission Coordinator:  Trish Mage, time1508/Date04/13/15

## 2013-06-13 NOTE — Progress Notes (Signed)
ANTICOAGULATION CONSULT NOTE - Follow Up  Pharmacy Consult for warfarin Indication: VTE prophylaxis  Allergies  Allergen Reactions  . Other     NESTLE CHOCOLATE DRINK - ? COCOA    Patient Measurements: Height: 5\' 9"  (175.3 cm) Weight: 255 lb (115.667 kg) IBW/kg (Calculated) : 70.7 Heparin Dosing Weight:   Vital Signs: Temp: 98.4 F (36.9 C) (04/10 0650) Temp src: Oral (04/10 0650) BP: 159/71 mmHg (04/10 0650) Pulse Rate: 83 (04/10 0650)  Labs:  Recent Labs  06/11/13 1120 06/12/13 0445 06/13/13 0510  HGB 12.1* 9.2* 8.2*  HCT 35.8* 27.1* 23.5*  PLT 209 181 163  LABPROT  --  14.4 13.9  INR  --  1.14 1.09  CREATININE 1.04 0.97 0.80    Estimated Creatinine Clearance: 109.3 ml/min (by C-G formula based on Cr of 0.8).   Medical History: Past Medical History  Diagnosis Date  . Diabetes mellitus without complication   . Hyperlipidemia   . Hypertension   . Sleep apnea     USES CPAP SETTING 14  . Arthritis     KNEES AND SHOULDERS  . Anxiety   . Depression   . H/O diverticulitis of colon   . GERD (gastroesophageal reflux disease)     Medications:  Prescriptions prior to admission  Medication Sig Dispense Refill  . cholecalciferol (VITAMIN D) 1000 UNITS tablet Take 5,000 Units by mouth every morning.       Marland Kitchen. FLUoxetine (PROZAC) 20 MG tablet Take 20 mg by mouth 2 (two) times daily.      Marland Kitchen. glipiZIDE (GLUCOTROL) 5 MG tablet Take 10 mg by mouth 2 (two) times daily.       Marland Kitchen. lisinopril (PRINIVIL,ZESTRIL) 20 MG tablet Take 20 mg by mouth every morning.      . metFORMIN (GLUCOPHAGE) 1000 MG tablet Take 1,000 mg by mouth 2 (two) times daily with a meal.      . naproxen (NAPROSYN) 500 MG tablet Take 500 mg by mouth 2 (two) times daily with a meal.      . omeprazole (PRILOSEC) 20 MG capsule Take 20 mg by mouth 2 (two) times daily.       . simvastatin (ZOCOR) 20 MG tablet Take 20 mg by mouth at bedtime.      . [DISCONTINUED] aspirin EC 81 MG tablet Take 81 mg by mouth at  bedtime.       Scheduled:  . docusate sodium  100 mg Oral BID  . enoxaparin (LOVENOX) injection  30 mg Subcutaneous Q12H  . FLUoxetine  20 mg Oral BID  . glipiZIDE  10 mg Oral BID WC  . insulin aspart  0-15 Units Subcutaneous TID WC  . insulin aspart  0-5 Units Subcutaneous QHS  . metFORMIN  1,000 mg Oral BID WC  . pantoprazole  40 mg Oral BID  . simvastatin  20 mg Oral QHS  . warfarin   Does not apply Once  . Warfarin - Pharmacist Dosing Inpatient   Does not apply q1800    Assessment: Patient s/p TOTAL KNEE BILATERAL (Bilateral) on 4/8.  Warfarin started 4/9 POD1 per protocol at a reduced dose of 4mg   INR subtherapeutic as expected after first dose 4/9  Hgb and plts dropping post op - monitor  No reported bleeding  Note: Epidural pulled this morning by anesthesiology around ~0945. To start Lovenox 30mg  SQ q12 12 hrs after epidural pulled which will be around 10pm tonight  Goal of Therapy:  INR 2-3    Plan:  1) Warfarin 7.5mg  tonight (note higher dose per protocol now that epidural d/c'd) 2) D/C Lovenox when INR > 2 per orders 3) Daily INR   Hessie Knows, PharmD, BCPS Pager [phone] 06/13/2013 10:25 AM

## 2013-06-13 NOTE — Progress Notes (Signed)
Rehab admissions - I spoke with Dr. Alusio's Tana FeltsPA.  We will plan for acute inpatient rehab admission tomorrow, Saturday, if patient remains stable.  Bed available and will follow up in am.  Call me for questions.  #[phone]

## 2013-06-13 NOTE — Evaluation (Signed)
Occupational Therapy Evaluation Patient Details Name: Darryl Chang MRN: [id number] DOB: [birth year] Today's Date: 06/13/2013    History of Present Illness BIL tkr   Clinical Impression   Pt was admitted for the above surgeries.  He was independent/modified independent with adls prior to admission.  Pt currently needs total A x 2 for LB dressing and max A x 2 for LB bathing as well as mod A x 2 for transfer to 3:1 commode.  Epidural was removed earlier today, and pain present but improving.  Pt will benefit from skilled OT to increase safety and independence with adls.  Goals in acute are for min  A overall.  Pt would be an excellent CIR candidate.    Follow Up Recommendations  CIR    Equipment Recommendations  3 in 1 bedside comode    Recommendations for Other Services       Precautions / Restrictions Precautions Precautions: Knee;Fall Required Braces or Orthoses: Knee Immobilizer - Right;Knee Immobilizer - Left Knee Immobilizer - Right: Discontinue once straight leg raise with < 10 degree lag Knee Immobilizer - Left: Discontinue once straight leg raise with < 10 degree lag Restrictions Other Position/Activity Restrictions: WBAT      Mobility Bed Mobility           Sit to supine: Mod assist;+2 for physical assistance   General bed mobility comments: cues for sequence; assist for Bil LEs and guided trunk  Transfers     Transfers: Sit to/from Stand Sit to Stand: Mod assist;+2 physical assistance;From elevated surface         General transfer comment: cues for sequence, LE management and use of UEs to self assist    Balance                                            ADL Overall ADL's : Needs assistance/impaired                         Toilet Transfer: Moderate assistance;+2 for physical assistance;Stand-pivot;BSC Toilet Transfer Details (indicate cue type and reason): cues for sequence, hand placement, and walking LEs  out Toileting- Clothing Manipulation and Hygiene: Total assistance;Sitting/lateral lean       Functional mobility during ADLs: +2 for physical assistance;Moderate assistance General ADL Comments: Pt can perform UB adls and grooming with set up.  He needs A x 2 for LB adls for sit to stand:  LB bathing:  max A and LB dressing, total A   Pt has a reacher:  Educated on uses for [employer].       Vision                     Perception     Praxis      Pertinent Vitals/Pain Pt was premedicated but had increased pain with weight bearing (8-9/10)  Decreased when repositioned in bed     Hand Dominance     Extremity/Trunk Assessment Upper Extremity Assessment Upper Extremity Assessment: Overall WFL for tasks assessed           Communication Communication Communication: No difficulties   Cognition Arousal/Alertness: Awake/alert Behavior During Therapy: WFL for tasks assessed/performed Overall Cognitive Status: Within Functional Limits for tasks assessed                     General Comments  Exercises       Shoulder Instructions      Home Living Family/patient expects to be discharged to:: Inpatient rehab                                        Prior Functioning/Environment Level of Independence: Independent;Independent with assistive device(s)             OT Diagnosis: Generalized weakness   OT Problem List: Decreased strength;Decreased activity tolerance   OT Treatment/Interventions: Self-care/ADL training;DME and/or AE instruction;Patient/family education    OT Goals(Current goals can be found in the care plan section) Acute Rehab OT Goals Patient Stated Goal: decreased pain and get back to being independent with adls OT Goal Formulation: With patient Time For Goal Achievement: 06/20/13 Potential to Achieve Goals: Good ADL Goals Pt Will Perform Grooming: with min guard assist;standing Pt Will Perform Lower Body Bathing: with  min assist;with adaptive equipment;sit to/from stand Pt Will Transfer to Toilet: with min assist;bedside commode;ambulating Pt Will Perform Toileting - Clothing Manipulation and hygiene: with min assist;sit to/from stand  OT Frequency: Min 2X/week   Barriers to D/C:            Co-evaluation PT/OT/SLP Co-Evaluation/Treatment: Yes Reason for Co-Treatment: For patient/therapist safety PT goals addressed during session: Mobility/safety with mobility OT goals addressed during session: ADL's and self-care      End of Session    Activity Tolerance: Patient tolerated treatment well Patient left: in bed;with call bell/phone within reach   Time: 1442-1530 (Pt was on commode for 20 minutes with therapist briefly stopping in to place washcloths behind thighs at top of brace and readjust feet as pt had moved support away.   OT Time Calculation (min): 48 min  Adjjusted time 28 minutes Charges:  OT General Charges $OT Visit: 1 Procedure OT Evaluation $Initial OT Evaluation Tier I: 1 Procedure OT Treatments $Self Care/Home Management : 8-22 mins G-Codes:    Darryl Chang 06/13/2013, 4:05 PM  Darryl OtterMaryellen Gad Chang, OTR/L [phone] 06/13/2013

## 2013-06-13 NOTE — Progress Notes (Signed)
Epidural d/c'd this morning.  Site looks good.   Tip intact.  Pain meds ordered.    Baylen Buckner MD

## 2013-06-13 NOTE — Progress Notes (Signed)
Rehab admissions - Evaluated for possible admission.  I met with patient, gave him booklets and answered questions about inpatient rehab.  Patient feels he will not be released to rehab until Monday.  I will plan for inpatient rehab admission on Monday.  Call me for questions.  #[phone]

## 2013-06-14 LAB — CBC
HCT: 23.7 % — ABNORMAL LOW (ref 39.0–52.0)
Hemoglobin: 7.9 g/dL — ABNORMAL LOW (ref 13.0–17.0)
MCH: 26.9 pg (ref 26.0–34.0)
MCHC: 33.3 g/dL (ref 30.0–36.0)
MCV: 80.6 fL (ref 78.0–100.0)
PLATELETS: 179 10*3/uL (ref 150–400)
RBC: 2.94 MIL/uL — ABNORMAL LOW (ref 4.22–5.81)
RDW: 13.7 % (ref 11.5–15.5)
WBC: 14 10*3/uL — AB (ref 4.0–10.5)

## 2013-06-14 LAB — GLUCOSE, CAPILLARY
GLUCOSE-CAPILLARY: 151 mg/dL — AB (ref 70–99)
Glucose-Capillary: 144 mg/dL — ABNORMAL HIGH (ref 70–99)
Glucose-Capillary: 143 mg/dL — ABNORMAL HIGH (ref 70–99)
Glucose-Capillary: 152 mg/dL — ABNORMAL HIGH (ref 70–99)

## 2013-06-14 LAB — PREPARE RBC (CROSSMATCH)

## 2013-06-14 LAB — PROTIME-INR
INR: 1.16 (ref 0.00–1.49)
Prothrombin Time: 14.6 seconds (ref 11.6–15.2)

## 2013-06-14 MED ORDER — ACETAMINOPHEN 325 MG PO TABS
650.0000 mg | ORAL_TABLET | Freq: Once | ORAL | Status: AC
  Administered 2013-06-14: 650 mg via ORAL
  Filled 2013-06-14: qty 2

## 2013-06-14 MED ORDER — WARFARIN SODIUM 7.5 MG PO TABS
7.5000 mg | ORAL_TABLET | Freq: Once | ORAL | Status: AC
  Administered 2013-06-14: 7.5 mg via ORAL
  Filled 2013-06-14: qty 1

## 2013-06-14 MED ORDER — DIPHENHYDRAMINE HCL 25 MG PO CAPS
25.0000 mg | ORAL_CAPSULE | Freq: Once | ORAL | Status: AC
  Administered 2013-06-14: 25 mg via ORAL
  Filled 2013-06-14: qty 1

## 2013-06-14 NOTE — Progress Notes (Signed)
Darryl BandyRichard Chang  MRN: [id number] DOB/Age: [birth year] 68 y.o. Physician: Jacquelyne BalintKevin Supple,MD Procedure: Procedure(s) (LRB): TOTAL KNEE BILATERAL (Bilateral)     Subjective: Pain was poorly controlled yesterday after epidural dc'd but improving. Feels very Tired with poor energy. Also has not voided yet requiring I&O cath  Vital Signs Temp:  [97.4 F (36.3 C)-98.9 F (37.2 C)] 98.9 F (37.2 C) (04/11 0622) Pulse Rate:  [97-105] 99 (04/11 0622) Resp:  [16-20] 16 (04/11 0622) BP: (154-160)/(76-77) 154/77 mmHg (04/11 0622) SpO2:  [92 %-99 %] 92 % (04/11 0622)  Lab Results  Recent Labs  06/13/13 0510 06/14/13 0512  WBC 13.0* 14.0*  HGB 8.2* 7.9*  HCT 23.5* 23.7*  PLT 163 179   BMET  Recent Labs  06/12/13 0445 06/13/13 0510  NA 136* 138  K 4.6 4.3  CL 100 103  CO2 24 24  GLUCOSE 215* 197*  BUN 28* 25*  CREATININE 0.97 0.80  CALCIUM 8.5 8.7   INR  Date Value Ref Range Status  06/14/2013 1.16  0.00 - 1.49 Final     Exam Bilateral knees clean and dry with minimal swelling NVI  HGB 7.9        Plan Will go ahead and transfuse for symptomatic post op anemia Plan is to DC to rehab  Continue oral analgesics to improve pain control  Push po intake and continue to monitor voiding.  French Anaracy Karlee Staff for Medco Health SolutionsDr.Kevin Supple 06/14/2013, 8:24 AM

## 2013-06-14 NOTE — Progress Notes (Signed)
Physical Therapy Treatment Patient Details Name: Darryl BandyRichard Chang MRN: [id number] DOB: 1945-05-28 Today's Date: 06/14/2013    History of Present Illness BIL tkr    PT Comments    OOB deferred pending first unit of transfusion  Follow Up Recommendations  CIR     Equipment Recommendations  None recommended by PT    Recommendations for Other Services OT consult     Precautions / Restrictions Precautions Precautions: Knee;Fall Required Braces or Orthoses: Knee Immobilizer - Right;Knee Immobilizer - Left Knee Immobilizer - Right: Discontinue once straight leg raise with < 10 degree lag Knee Immobilizer - Left: Discontinue once straight leg raise with < 10 degree lag Restrictions Weight Bearing Restrictions: No Other Position/Activity Restrictions: WBAT    Mobility  Bed Mobility Overal bed mobility:  (Deferred to after first unit of blood)                Transfers                    Ambulation/Gait                 Stairs            Wheelchair Mobility    Modified Rankin (Stroke Patients Only)       Balance                                    Cognition Arousal/Alertness: Awake/alert Behavior During Therapy: WFL for tasks assessed/performed Overall Cognitive Status: Within Functional Limits for tasks assessed                      Exercises Total Joint Exercises Ankle Circles/Pumps: Supine;Both;AAROM;20 reps Quad Sets: AROM;AAROM;Both;20 reps;Supine Heel Slides: Both;Supine;AAROM;20 reps Straight Leg Raises: AAROM;Both;20 reps;Supine    General Comments        Pertinent Vitals/Pain 7.10; premed, ice packs provided    Home Living                      Prior Function            PT Goals (current goals can now be found in the care plan section) Acute Rehab PT Goals Patient Stated Goal: decreased pain and get back to being independent with adls PT Goal Formulation: With patient Time For  Goal Achievement: 06/19/13 Potential to Achieve Goals: Good Progress towards PT goals: Progressing toward goals    Frequency  7X/week    PT Plan Current plan remains appropriate    Co-evaluation             End of Session Equipment Utilized During Treatment: Right knee immobilizer;Left knee immobilizer Activity Tolerance: Patient tolerated treatment well Patient left: in bed;with call bell/phone within reach     Time: 0923-0953 PT Time Calculation (min): 30 min  Charges:  $Therapeutic Exercise: 23-37 mins                    G Codes:      Brien FewHunter P Quinlynn Cuthbert 06/14/2013, 12:48 PM

## 2013-06-14 NOTE — Progress Notes (Signed)
ANTICOAGULATION CONSULT NOTE - Follow Up  Pharmacy Consult for warfarin Indication: VTE prophylaxis  Allergies  Allergen Reactions  . Other     NESTLE CHOCOLATE DRINK - ? COCOA    Patient Measurements: Height: 5\' 9"  (175.3 cm) Weight: 255 lb (115.667 kg) IBW/kg (Calculated) : 70.7  Vital Signs: Temp: 98.9 F (37.2 C) (04/11 0622) Temp src: Oral (04/11 0622) BP: 154/77 mmHg (04/11 0622) Pulse Rate: 99 (04/11 0622)  Labs:  Recent Labs  06/11/13 1120 06/12/13 0445 06/13/13 0510 06/14/13 0512  HGB 12.1* 9.2* 8.2* 7.9*  HCT 35.8* 27.1* 23.5* 23.7*  PLT 209 181 163 179  LABPROT  --  14.4 13.9 14.6  INR  --  1.14 1.09 1.16  CREATININE 1.04 0.97 0.80  --     Estimated Creatinine Clearance: 109.3 ml/min (by C-G formula based on Cr of 0.8).   Medications:  Prescriptions prior to admission  Medication Sig Dispense Refill  . cholecalciferol (VITAMIN D) 1000 UNITS tablet Take 5,000 Units by mouth every morning.       Marland Kitchen. FLUoxetine (PROZAC) 20 MG tablet Take 20 mg by mouth 2 (two) times daily.      Marland Kitchen. glipiZIDE (GLUCOTROL) 5 MG tablet Take 10 mg by mouth 2 (two) times daily.       Marland Kitchen. lisinopril (PRINIVIL,ZESTRIL) 20 MG tablet Take 20 mg by mouth every morning.      . metFORMIN (GLUCOPHAGE) 1000 MG tablet Take 1,000 mg by mouth 2 (two) times daily with a meal.      . naproxen (NAPROSYN) 500 MG tablet Take 500 mg by mouth 2 (two) times daily with a meal.      . omeprazole (PRILOSEC) 20 MG capsule Take 20 mg by mouth 2 (two) times daily.       . simvastatin (ZOCOR) 20 MG tablet Take 20 mg by mouth at bedtime.      . [DISCONTINUED] aspirin EC 81 MG tablet Take 81 mg by mouth at bedtime.       Scheduled:  . docusate sodium  100 mg Oral BID  . enoxaparin (LOVENOX) injection  30 mg Subcutaneous Q12H  . FLUoxetine  20 mg Oral BID  . glipiZIDE  10 mg Oral BID WC  . insulin aspart  0-15 Units Subcutaneous TID WC  . insulin aspart  0-5 Units Subcutaneous QHS  . metFORMIN  1,000 mg  Oral BID WC  . pantoprazole  40 mg Oral BID  . simvastatin  20 mg Oral QHS  . Warfarin - Pharmacist Dosing Inpatient   Does not apply q1800   Inpatient warfarin doses: 4mg  (4/9), 7.5mg  (4/10)  Assessment: Patient s/p TOTAL KNEE BILATERAL (Bilateral) on 4/8.  Warfarin started 4/9 POD1 per protocol at a reduced dose of 4mg   INR subtherapeutic as expected after 2 doses  Hgb and plts dropping post op - monitor  No reported bleeding  Note: Epidural pulled 06/13/13 by anesthesiology around ~0945. Lovenox 30mg  SQ q12 started 12 hrs after epidural pulled   Goal of Therapy:  INR 2-3    Plan:   Repeat warfarin 7.5mg  today at 18:00  D/C Lovenox when INR > 2 per orders  Daily INR  Warfarin education completed 4/9  Loralee PacasErin Amandamarie Feggins, PharmD, BCPS Pager: [phone]  06/14/2013 8:23 AM

## 2013-06-14 NOTE — Progress Notes (Signed)
PT Cancellation Note  Patient Details Name: Darryl BandyRichard Chang MRN: [id number] DOB: 21-Aug-1945   Cancelled Treatment:     PM session deferred at request of pt - fatigue and transfusion just starting. Pt states he will do CPM later.  Will follow in am.   Brien FewHunter P Jeweliana Dudgeon 06/14/2013, 2:47 PM

## 2013-06-15 ENCOUNTER — Inpatient Hospital Stay (HOSPITAL_COMMUNITY): Payer: Medicare Other | Admitting: *Deleted

## 2013-06-15 ENCOUNTER — Inpatient Hospital Stay (HOSPITAL_COMMUNITY): Payer: Medicare Other | Admitting: Physical Therapy

## 2013-06-15 LAB — GLUCOSE, CAPILLARY
Glucose-Capillary: 139 mg/dL — ABNORMAL HIGH (ref 70–99)
Glucose-Capillary: 122 mg/dL — ABNORMAL HIGH (ref 70–99)
Glucose-Capillary: 162 mg/dL — ABNORMAL HIGH (ref 70–99)
Glucose-Capillary: 191 mg/dL — ABNORMAL HIGH (ref 70–99)

## 2013-06-15 LAB — TYPE AND SCREEN
ABO/RH(D): O POS
Antibody Screen: NEGATIVE
Unit division: 0
Unit division: 0

## 2013-06-15 LAB — PROTIME-INR
INR: 1.6 — AB (ref 0.00–1.49)
Prothrombin Time: 18.6 seconds — ABNORMAL HIGH (ref 11.6–15.2)

## 2013-06-15 LAB — HEMOGLOBIN AND HEMATOCRIT, BLOOD
HCT: 27.5 % — ABNORMAL LOW (ref 39.0–52.0)
Hemoglobin: 9.3 g/dL — ABNORMAL LOW (ref 13.0–17.0)

## 2013-06-15 MED ORDER — WARFARIN SODIUM 4 MG PO TABS
4.0000 mg | ORAL_TABLET | Freq: Once | ORAL | Status: DC
  Administered 2013-06-15: 4 mg via ORAL
  Filled 2013-06-15: qty 1

## 2013-06-15 MED ORDER — ALUM & MAG HYDROXIDE-SIMETH 200-200-20 MG/5ML PO SUSP
30.0000 mL | ORAL | Status: DC | PRN
  Administered 2013-06-16: 30 mL via ORAL
  Filled 2013-06-15: qty 30

## 2013-06-15 NOTE — Progress Notes (Signed)
ANTICOAGULATION CONSULT NOTE - Follow Up  Pharmacy Consult for warfarin Indication: VTE prophylaxis  Allergies  Allergen Reactions  . Other     NESTLE CHOCOLATE DRINK - ? COCOA    Patient Measurements: Height: 5\' 9"  (175.3 cm) Weight: 255 lb (115.667 kg) IBW/kg (Calculated) : 70.7  Vital Signs: Temp: 98.3 F (36.8 C) (04/12 0607) Temp src: Oral (04/12 0607) BP: 149/68 mmHg (04/12 0607) Pulse Rate: 95 (04/12 0607)  Labs:  Recent Labs  06/13/13 0510 06/14/13 0512 06/15/13 0530  HGB 8.2* 7.9* 9.3*  HCT 23.5* 23.7* 27.5*  PLT 163 179  --   LABPROT 13.9 14.6 18.6*  INR 1.09 1.16 1.60*  CREATININE 0.80  --   --     Estimated Creatinine Clearance: 109.3 ml/min (by C-G formula based on Cr of 0.8).   Medications:  Prescriptions prior to admission  Medication Sig Dispense Refill  . cholecalciferol (VITAMIN D) 1000 UNITS tablet Take 5,000 Units by mouth every morning.       Marland Kitchen. FLUoxetine (PROZAC) 20 MG tablet Take 20 mg by mouth 2 (two) times daily.      Marland Kitchen. glipiZIDE (GLUCOTROL) 5 MG tablet Take 10 mg by mouth 2 (two) times daily.       Marland Kitchen. lisinopril (PRINIVIL,ZESTRIL) 20 MG tablet Take 20 mg by mouth every morning.      . metFORMIN (GLUCOPHAGE) 1000 MG tablet Take 1,000 mg by mouth 2 (two) times daily with a meal.      . naproxen (NAPROSYN) 500 MG tablet Take 500 mg by mouth 2 (two) times daily with a meal.      . omeprazole (PRILOSEC) 20 MG capsule Take 20 mg by mouth 2 (two) times daily.       . simvastatin (ZOCOR) 20 MG tablet Take 20 mg by mouth at bedtime.      . [DISCONTINUED] aspirin EC 81 MG tablet Take 81 mg by mouth at bedtime.       Scheduled:  . docusate sodium  100 mg Oral BID  . enoxaparin (LOVENOX) injection  30 mg Subcutaneous Q12H  . FLUoxetine  20 mg Oral BID  . glipiZIDE  10 mg Oral BID WC  . insulin aspart  0-15 Units Subcutaneous TID WC  . insulin aspart  0-5 Units Subcutaneous QHS  . metFORMIN  1,000 mg Oral BID WC  . pantoprazole  40 mg Oral BID   . simvastatin  20 mg Oral QHS  . Warfarin - Pharmacist Dosing Inpatient   Does not apply q1800   Inpatient warfarin doses: 4mg  (4/9), 7.5mg  (4/10), 7.5mg  (4/11)  Assessment: Patient s/p TOTAL KNEE BILATERAL (Bilateral) on 4/8.  Warfarin started 4/9 POD1 per protocol at a reduced dose of 4mg   INR subtherapeutic but rising quickly (1.6) after 3 doses  Hgb improved today after transfusion on 4/11  No reported bleeding  Goal of Therapy:  INR 2-3    Plan:   Decrease warfarin 4mg  today at 18:00  D/C Lovenox when INR > 2 per orders  Daily INR  Warfarin education completed 4/9  Loralee PacasErin Ilijah Doucet, PharmD, BCPS Pager: [phone]  06/15/2013 8:53 AM

## 2013-06-15 NOTE — Progress Notes (Signed)
Physical Therapy Treatment Patient Details Name: Darryl Chang MRN: [id number] DOB: 1946/02/22 Today's Date: 06/15/2013    History of Present Illness BIL tkr    PT Comments    Patient able to walk farther today, still with significant pain and c/o weakness.  Remains appropriate for inpatient rehab at this time.  Motivated and tolerating exercises despite discomfort.  Follow Up Recommendations  CIR     Equipment Recommendations  None recommended by PT    Recommendations for Other Services       Precautions / Restrictions Precautions Precautions: Knee;Fall Required Braces or Orthoses: Knee Immobilizer - Right;Knee Immobilizer - Left Knee Immobilizer - Right: Discontinue once straight leg raise with < 10 degree lag Knee Immobilizer - Left: Discontinue once straight leg raise with < 10 degree lag Restrictions Other Position/Activity Restrictions: WBAT    Mobility  Bed Mobility   Bed Mobility: Supine to Sit       Sit to supine: +2 for physical assistance;Mod assist;HOB elevated   General bed mobility comments: cues and assist for bil LE's and for lifting trunk upright  Transfers Overall transfer level: Needs assistance Equipment used: Rolling walker (2 wheeled)   Sit to Stand: From elevated surface;+2 physical assistance;Max assist         General transfer comment: assist for lifting off elevated seating surface; cues for technique and UE use to assist  Ambulation/Gait Ambulation/Gait assistance: Mod assist;+2 safety/equipment Ambulation Distance (Feet): 20 Feet Assistive device: Rolling walker (2 wheeled) Gait Pattern/deviations: Step-to pattern;Decreased stride length;Shuffle;Wide base of support     General Gait Details: cues for weight shift, for sequence and posture   Stairs            Wheelchair Mobility    Modified Rankin (Stroke Patients Only)       Balance Overall balance assessment: Needs assistance         Standing balance  support: Bilateral upper extremity supported Standing balance-Leahy Scale: Poor Standing balance comment: UE assist and physical help to maintain balance                    Cognition Arousal/Alertness: Awake/alert Behavior During Therapy: WFL for tasks assessed/performed Overall Cognitive Status: Within Functional Limits for tasks assessed                      Exercises Total Joint Exercises Ankle Circles/Pumps: Supine;Both;AAROM;20 reps Quad Sets: AROM;AAROM;Both;Supine;10 reps Heel Slides: Both;Supine;AAROM;Other reps (comment) (8) Straight Leg Raises: AAROM;Both;Supine;Other reps (comment) (8) Goniometric ROM: bil knees AAROM -5 - 70    General Comments        Pertinent Vitals/Pain Moderate pain with exercises, slight increase with weight bearing (pt did not rate); ice applied after tx    Home Living                      Prior Function            PT Goals (current goals can now be found in the care plan section) Progress towards PT goals: Progressing toward goals    Frequency  7X/week    PT Plan Current plan remains appropriate    Co-evaluation             End of Session Equipment Utilized During Treatment: Gait belt;Right knee immobilizer;Left knee immobilizer Activity Tolerance: Patient limited by fatigue Patient left: in chair;with call bell/phone within reach     Time: 1005-1035 PT Time Calculation (min): 30 min  Charges:  $  Gait Training: 8-22 mins $Therapeutic Exercise: 8-22 mins                    G Codes:      Ane PaymentCynthia R Kennesha Brewbaker 06/15/2013, 11:56 AM Sheran Lawlessyndi Ashwin Tibbs, PT [phone] 06/15/2013

## 2013-06-15 NOTE — Progress Notes (Signed)
   Subjective: 4 Days Post-Op Procedure(s) (LRB): TOTAL KNEE BILATERAL (Bilateral) Patient reports pain as moderate.   Patient seen in rounds with Dr. Darrelyn HillockGioffre. Patient is having issues with indigestion as well as constipation. He reports that he is having pain in the knees but is better controlled today than yesterday. He denies SOB and chest pain. No issues overnight.  Plan is to go CIR after hospital stay.  Objective: Vital signs in last 24 hours: Temp:  [97.4 F (36.3 C)-98.6 F (37 C)] 98.3 F (36.8 C) (04/12 0607) Pulse Rate:  [82-103] 95 (04/12 0607) Resp:  [16-18] 16 (04/12 0607) BP: (132-165)/(64-87) 149/68 mmHg (04/12 0607) SpO2:  [92 %-99 %] 92 % (04/12 0607)  Intake/Output from previous day:  Intake/Output Summary (Last 24 hours) at 06/15/13 0759 Last data filed at 06/15/13 0500  Gross per 24 hour  Intake 1546.67 ml  Output   1500 ml  Net  46.67 ml    Intake/Output this shift:    Labs:  Recent Labs  06/13/13 0510 06/14/13 0512  HGB 8.2* 7.9*    Recent Labs  06/13/13 0510 06/14/13 0512  WBC 13.0* 14.0*  RBC 2.99* 2.94*  HCT 23.5* 23.7*  PLT 163 179    Recent Labs  06/13/13 0510  NA 138  K 4.3  CL 103  CO2 24  BUN 25*  CREATININE 0.80  GLUCOSE 197*  CALCIUM 8.7    Recent Labs  06/14/13 0512 06/15/13 0530  INR 1.16 1.60*    EXAM General - Patient is Alert and Oriented Extremity - Neurologically intact Intact pulses distally Dorsiflexion/Plantar flexion intact No cellulitis present Compartment soft Dressing/Incision - clean, dry, no drainage Motor Function - intact, moving foot and toes well on exam.   Past Medical History  Diagnosis Date  . Diabetes mellitus without complication   . Hyperlipidemia   . Hypertension   . Sleep apnea     USES CPAP SETTING 14  . Arthritis     KNEES AND SHOULDERS  . Anxiety   . Depression   . H/O diverticulitis of colon   . GERD (gastroesophageal reflux disease)     Assessment/Plan: 4  Days Post-Op Procedure(s) (LRB): TOTAL KNEE BILATERAL (Bilateral) Principal Problem:   OA (osteoarthritis) of knee Active Problems:   Postoperative anemia due to acute blood loss  Estimated body mass index is 37.64 kg/(m^2) as calculated from the following:   Height as of this encounter: 5\' 9"  (1.753 m).   Weight as of this encounter: 115.667 kg (255 lb). Advance diet Up with therapy Plan for discharge tomorrow  DVT Prophylaxis - Lovenox and Coumadin Weight-Bearing as tolerated   Continue with therapy today. Plan for DC to CIR tomorrow. Will give Maalox as well as dulcolax today.   Raylynn Hersh Tamala SerLauren Amee Boothe 06/15/2013, 7:59 AM

## 2013-06-16 ENCOUNTER — Inpatient Hospital Stay (HOSPITAL_COMMUNITY): Payer: Medicare Other

## 2013-06-16 ENCOUNTER — Inpatient Hospital Stay (HOSPITAL_COMMUNITY)
Admission: RE | Admit: 2013-06-16 | Discharge: 2013-06-25 | DRG: 945 | Disposition: A | Discharge status: Home / Self Care | Payer: Medicare Other | Source: Intra-hospital | Attending: Physical Medicine & Rehabilitation | Admitting: Physical Medicine & Rehabilitation

## 2013-06-16 ENCOUNTER — Encounter (HOSPITAL_COMMUNITY): Payer: Medicare Other | Admitting: Occupational Therapy

## 2013-06-16 DIAGNOSIS — F329 Major depressive disorder, single episode, unspecified: Secondary | ICD-10-CM

## 2013-06-16 DIAGNOSIS — E785 Hyperlipidemia, unspecified: Secondary | ICD-10-CM

## 2013-06-16 DIAGNOSIS — I10 Essential (primary) hypertension: Secondary | ICD-10-CM

## 2013-06-16 DIAGNOSIS — G4730 Sleep apnea, unspecified: Secondary | ICD-10-CM

## 2013-06-16 DIAGNOSIS — E1149 Type 2 diabetes mellitus with other diabetic neurological complication: Secondary | ICD-10-CM

## 2013-06-16 DIAGNOSIS — M1710 Unilateral primary osteoarthritis, unspecified knee: Secondary | ICD-10-CM

## 2013-06-16 DIAGNOSIS — D62 Acute posthemorrhagic anemia: Secondary | ICD-10-CM

## 2013-06-16 DIAGNOSIS — E1142 Type 2 diabetes mellitus with diabetic polyneuropathy: Secondary | ICD-10-CM

## 2013-06-16 DIAGNOSIS — F3289 Other specified depressive episodes: Secondary | ICD-10-CM

## 2013-06-16 DIAGNOSIS — K5900 Constipation, unspecified: Secondary | ICD-10-CM

## 2013-06-16 DIAGNOSIS — K649 Unspecified hemorrhoids: Secondary | ICD-10-CM

## 2013-06-16 DIAGNOSIS — Z79899 Other long term (current) drug therapy: Secondary | ICD-10-CM

## 2013-06-16 DIAGNOSIS — Z5189 Encounter for other specified aftercare: Principal | ICD-10-CM

## 2013-06-16 DIAGNOSIS — F411 Generalized anxiety disorder: Secondary | ICD-10-CM

## 2013-06-16 DIAGNOSIS — Z87891 Personal history of nicotine dependence: Secondary | ICD-10-CM

## 2013-06-16 DIAGNOSIS — Z96653 Presence of artificial knee joint, bilateral: Secondary | ICD-10-CM

## 2013-06-16 DIAGNOSIS — Z96659 Presence of unspecified artificial knee joint: Secondary | ICD-10-CM

## 2013-06-16 DIAGNOSIS — K219 Gastro-esophageal reflux disease without esophagitis: Secondary | ICD-10-CM

## 2013-06-16 LAB — GLUCOSE, CAPILLARY
GLUCOSE-CAPILLARY: 146 mg/dL — AB (ref 70–99)
Glucose-Capillary: 128 mg/dL — ABNORMAL HIGH (ref 70–99)
Glucose-Capillary: 167 mg/dL — ABNORMAL HIGH (ref 70–99)
Glucose-Capillary: 148 mg/dL — ABNORMAL HIGH (ref 70–99)

## 2013-06-16 LAB — COMPREHENSIVE METABOLIC PANEL
ALK PHOS: 57 U/L (ref 39–117)
ALT: 20 U/L (ref 0–53)
AST: 22 U/L (ref 0–37)
Albumin: 3.1 g/dL — ABNORMAL LOW (ref 3.5–5.2)
BILIRUBIN TOTAL: 0.9 mg/dL (ref 0.3–1.2)
BUN: 23 mg/dL (ref 6–23)
CHLORIDE: 95 meq/L — AB (ref 96–112)
CO2: 27 mEq/L (ref 19–32)
Calcium: 9.5 mg/dL (ref 8.4–10.5)
Creatinine, Ser: 0.8 mg/dL (ref 0.50–1.35)
GFR calc Af Amer: >90 mL/min (ref >90)
GFR calc non Af Amer: 89 mL/min — ABNORMAL LOW (ref >90)
Glucose, Bld: 159 mg/dL — ABNORMAL HIGH (ref 70–99)
Potassium: 3.8 mEq/L (ref 3.7–5.3)
Sodium: 136 mEq/L — ABNORMAL LOW (ref 137–147)
Total Protein: 6.4 g/dL (ref 6.0–8.3)

## 2013-06-16 LAB — PROTIME-INR
INR: 1.99 — ABNORMAL HIGH (ref 0.00–1.49)
Prothrombin Time: 22 seconds — ABNORMAL HIGH (ref 11.6–15.2)

## 2013-06-16 MED ORDER — WARFARIN SODIUM 4 MG PO TABS: 4.0000 mg | ORAL_TABLET | Freq: Once | ORAL | Status: DC

## 2013-06-16 MED ORDER — ONDANSETRON HCL 4 MG/2ML IJ SOLN: 4.0000 mg | Freq: Four times a day (QID) | INTRAMUSCULAR | Status: DC | PRN

## 2013-06-16 MED ORDER — WARFARIN SODIUM 5 MG PO TABS
5.0000 mg | ORAL_TABLET | Freq: Once | ORAL | Status: DC
  Administered 2013-06-16: 5 mg via ORAL
  Filled 2013-06-16: qty 1

## 2013-06-16 MED ORDER — ACETAMINOPHEN 325 MG PO TABS
325.0000 mg | ORAL_TABLET | ORAL | Status: DC | PRN
  Administered 2013-06-19 – 2013-06-24 (×17): 650 mg via ORAL
  Administered 2013-06-24: 325 mg via ORAL
  Administered 2013-06-24 – 2013-06-25 (×2): 650 mg via ORAL
  Filled 2013-06-16 (×20): qty 2

## 2013-06-16 MED ORDER — WARFARIN - PHARMACIST DOSING INPATIENT
Freq: Every day | Status: DC
  Administered 2013-06-17 – 2013-06-18 (×2)

## 2013-06-16 MED ORDER — METFORMIN HCL 500 MG PO TABS
1000.0000 mg | ORAL_TABLET | Freq: Two times a day (BID) | ORAL | Status: DC
  Administered 2013-06-17 – 2013-06-25 (×17): 1000 mg via ORAL
  Filled 2013-06-16 (×20): qty 2

## 2013-06-16 MED ORDER — WARFARIN SODIUM 5 MG PO TABS: 5.0000 mg | ORAL_TABLET | Freq: Once | ORAL | Status: DC

## 2013-06-16 MED ORDER — ONDANSETRON HCL 4 MG PO TABS: 4.0000 mg | ORAL_TABLET | Freq: Four times a day (QID) | ORAL | Status: DC | PRN

## 2013-06-16 MED ORDER — INSULIN ASPART 100 UNIT/ML ~~LOC~~ SOLN
0.0000 [IU] | Freq: Three times a day (TID) | SUBCUTANEOUS | Status: DC
  Administered 2013-06-17: 2 [IU] via SUBCUTANEOUS
  Administered 2013-06-17: 3 [IU] via SUBCUTANEOUS
  Administered 2013-06-17: 2 [IU] via SUBCUTANEOUS
  Administered 2013-06-18: 3 [IU] via SUBCUTANEOUS
  Administered 2013-06-18: 2 [IU] via SUBCUTANEOUS
  Administered 2013-06-18 – 2013-06-19 (×2): 3 [IU] via SUBCUTANEOUS
  Administered 2013-06-19 – 2013-06-20 (×4): 2 [IU] via SUBCUTANEOUS
  Administered 2013-06-22: 3 [IU] via SUBCUTANEOUS
  Administered 2013-06-22 – 2013-06-25 (×4): 2 [IU] via SUBCUTANEOUS

## 2013-06-16 MED ORDER — FLEET ENEMA 7-19 GM/118ML RE ENEM
1.0000 | ENEMA | Freq: Once | RECTAL | Status: AC
  Administered 2013-06-16: 1 via RECTAL
  Filled 2013-06-16: qty 1

## 2013-06-16 MED ORDER — METHOCARBAMOL 500 MG PO TABS
500.0000 mg | ORAL_TABLET | Freq: Four times a day (QID) | ORAL | Status: DC | PRN
  Administered 2013-06-16 – 2013-06-20 (×9): 500 mg via ORAL
  Filled 2013-06-16 (×9): qty 1

## 2013-06-16 MED ORDER — POLYETHYLENE GLYCOL 3350 17 G PO PACK
17.0000 g | PACK | Freq: Every day | ORAL | Status: DC | PRN
  Filled 2013-06-16: qty 1

## 2013-06-16 MED ORDER — SIMVASTATIN 20 MG PO TABS
20.0000 mg | ORAL_TABLET | Freq: Every day | ORAL | Status: DC
Start: 2013-06-16 — End: 2013-06-25
  Administered 2013-06-16 – 2013-06-24 (×9): 20 mg via ORAL
  Filled 2013-06-16 (×10): qty 1

## 2013-06-16 MED ORDER — OXYCODONE HCL 5 MG PO TABS
5.0000 mg | ORAL_TABLET | ORAL | Status: DC | PRN
  Administered 2013-06-16: 15 mg via ORAL
  Administered 2013-06-17 (×2): 20 mg via ORAL
  Filled 2013-06-16 (×3): qty 4

## 2013-06-16 MED ORDER — FLUOXETINE HCL 20 MG PO TABS
20.0000 mg | ORAL_TABLET | Freq: Two times a day (BID) | ORAL | Status: DC
  Administered 2013-06-16 – 2013-06-25 (×18): 20 mg via ORAL
  Filled 2013-06-16 (×23): qty 1

## 2013-06-16 MED ORDER — BISACODYL 10 MG RE SUPP: 10.0000 mg | Freq: Every day | RECTAL | Status: DC | PRN

## 2013-06-16 MED ORDER — PANTOPRAZOLE SODIUM 40 MG PO TBEC
40.0000 mg | DELAYED_RELEASE_TABLET | Freq: Two times a day (BID) | ORAL | Status: DC
  Administered 2013-06-16 – 2013-06-25 (×17): 40 mg via ORAL
  Filled 2013-06-16 (×24): qty 1

## 2013-06-16 MED ORDER — SORBITOL 70 % SOLN
30.0000 mL | Freq: Every day | Status: DC | PRN
  Administered 2013-06-16: 30 mL via ORAL
  Filled 2013-06-16: qty 30

## 2013-06-16 MED ORDER — ALUM & MAG HYDROXIDE-SIMETH 200-200-20 MG/5ML PO SUSP
30.0000 mL | Freq: Four times a day (QID) | ORAL | Status: DC | PRN
  Administered 2013-06-16 – 2013-06-22 (×2): 30 mL via ORAL
  Filled 2013-06-16 (×2): qty 30

## 2013-06-16 MED ORDER — WARFARIN SODIUM 7.5 MG PO TABS: 7.5000 mg | ORAL_TABLET | Freq: Once | ORAL | Status: DC

## 2013-06-16 MED ORDER — ENOXAPARIN SODIUM 30 MG/0.3ML ~~LOC~~ SOLN
30.0000 mg | Freq: Two times a day (BID) | SUBCUTANEOUS | Status: DC
  Administered 2013-06-16: 30 mg via SUBCUTANEOUS
  Filled 2013-06-16 (×4): qty 0.3

## 2013-06-16 MED ORDER — DOCUSATE SODIUM 100 MG PO CAPS
100.0000 mg | ORAL_CAPSULE | Freq: Two times a day (BID) | ORAL | Status: DC
  Administered 2013-06-16 – 2013-06-21 (×10): 100 mg via ORAL
  Filled 2013-06-16 (×15): qty 1

## 2013-06-16 MED ORDER — GLIPIZIDE 10 MG PO TABS
10.0000 mg | ORAL_TABLET | Freq: Two times a day (BID) | ORAL | Status: DC
  Administered 2013-06-17 – 2013-06-25 (×17): 10 mg via ORAL
  Filled 2013-06-16 (×20): qty 1

## 2013-06-16 NOTE — Progress Notes (Signed)
Physical Therapy Treatment Patient Details Name: Darryl BandyRichard Chang MRN: [id number] DOB: 06-30-1945 Today's Date: 06/16/2013    History of Present Illness BIL tkr    PT Comments    Pt clammy, sweaty, BP 173/83, HR 118, sats 86, RN notified. Pt going for abd. Xray. Pt plans CIR when stable. Continue to monitor VS, encouraged IS and pursed lip breaths.  Follow Up Recommendations  CIR     Equipment Recommendations  None recommended by PT    Recommendations for Other Services       Precautions / Restrictions Precautions Precautions: Knee;Fall Required Braces or Orthoses: Knee Immobilizer - Right;Knee Immobilizer - Left Knee Immobilizer - Right: Discontinue once straight leg raise with < 10 degree lag Knee Immobilizer - Left: Discontinue once straight leg raise with < 10 degree lag Restrictions Weight Bearing Restrictions: No    Mobility  Bed Mobility   Bed Mobility: Supine to Sit     Supine to sit: Mod assist;+2 for physical assistance     General bed mobility comments: cues and assist for bil LE's and for lifting trunk upright  Transfers Overall transfer level: Needs assistance Equipment used: Rolling walker (2 wheeled)   Sit to Stand: +2 physical assistance;+2 safety/equipment;Max assist         General transfer comment: assist for lifting off elevated seating surface; cues for technique and UE use to assist  Ambulation/Gait Ambulation/Gait assistance: +2 safety/equipment;Mod assist Ambulation Distance (Feet):  (then 10' to [facility] Assistive device: Rolling walker (2 wheeled) Gait Pattern/deviations: Step-to pattern;Antalgic;Decreased stride length Gait velocity: decr   General Gait Details: cues for weight shift, for sequence and posture, stops for rest x 3, noted dyspnea and sweaty   Stairs            Wheelchair Mobility    Modified Rankin (Stroke Patients Only)       Balance           Standing balance support: Bilateral upper extremity  supported;During functional activity Standing balance-Leahy Scale: Poor                      Cognition Arousal/Alertness: Awake/alert                          Exercises      General Comments        Pertinent Vitals/Pain 7 prior to walking, decreased 5     Home Living                      Prior Function            PT Goals (current goals can now be found in the care plan section) Progress towards PT goals: Progressing toward goals    Frequency  7X/week    PT Plan Current plan remains appropriate    Co-evaluation             End of Session Equipment Utilized During Treatment: Gait belt;Right knee immobilizer;Left knee immobilizer Activity Tolerance: Patient limited by fatigue Patient left: in chair (with transporter)     Time: [id number] PT Time Calculation (min): 30 min  Charges:  $Gait Training: 23-37 mins                    G Codes:      Rada HayKaren Elizabeth Goldman Birchall 06/16/2013, 11:19 AM Blanchard KelchKaren Evely Gainey PT [phone]

## 2013-06-16 NOTE — Care Management (Signed)
Pt places self on CPap for the night @ previous settings. Will monotor

## 2013-06-16 NOTE — Progress Notes (Signed)
RT Note:  Spoke with patient about CPAP.  Patient states he is able to place himself on/off without assistance. Machine set up at bedside, 14.0 cm H20 with patient home FFM.  Patient encouraged to call for assistance if needed.

## 2013-06-16 NOTE — H&P (View-Only) (Signed)
Physical Medicine and Rehabilitation Admission H&P    No chief complaint on file. :  Chief complaint> knee pain  HPI: Darryl Chang is a 68 y.o. right-handed male admitted 06/11/2013 with progressive bilateral knee pain left greater than right and no relief with conservative care. Underwent bilateral total knee arthroplasties 06/11/2013 per Dr.Alusio. Postoperative pain management with epidural removed 06/13/2013. Coumadin for DVT prophylaxis. Weightbearing as tolerated bilateral lower extremities. Acute blood loss anemia 7.9 and transfused. Bouts of constipation with mild nausea. A KUB completed 06/16/2013 was negative. Physical and occupational therapy evaluations completed an ongoing with recommendations of physical medicine rehabilitation consult. M.D. has requested physical medicine rehabilitation consult. Patient was admitted for comprehensive rehabilitation program   ROS Review of Systems  Gastrointestinal:  GERD  Musculoskeletal: Positive for joint pain and myalgias.  Psychiatric/Behavioral: Positive for depression.  Anxiety  All other systems reviewed and are negative  Past Medical History  Diagnosis Date  . Diabetes mellitus without complication   . Hyperlipidemia   . Hypertension   . Sleep apnea     USES CPAP SETTING 14  . Arthritis     KNEES AND SHOULDERS  . Anxiety   . Depression   . H/O diverticulitis of colon   . GERD (gastroesophageal reflux disease)    Past Surgical History  Procedure Laterality Date  . Tonsillectomy    . Vasectomy    . Eye surgery      BILATERAL CATARACT EXTRACTION AND LENS IMPLANTS  . Total knee arthroplasty Bilateral 06/11/2013    Procedure: TOTAL KNEE BILATERAL;  Surgeon: Gearlean Alf, MD;  Location: [facility];  Service: Orthopedics;  Laterality: Bilateral;   History reviewed. No pertinent family history. Social History:  reports that he has quit smoking. His smoking use included Cigarettes. He has a 15 pack-year smoking history.  He does not have any smokeless tobacco history on file. He reports that he does not drink alcohol or use illicit drugs. Allergies:  Allergies  Allergen Reactions  . Other     NESTLE CHOCOLATE DRINK - ? COCOA   Medications Prior to Admission  Medication Sig Dispense Refill  . cholecalciferol (VITAMIN D) 1000 UNITS tablet Take 5,000 Units by mouth every morning.       Marland Kitchen FLUoxetine (PROZAC) 20 MG tablet Take 20 mg by mouth 2 (two) times daily.      Marland Kitchen glipiZIDE (GLUCOTROL) 5 MG tablet Take 10 mg by mouth 2 (two) times daily.       Marland Kitchen lisinopril (PRINIVIL,ZESTRIL) 20 MG tablet Take 20 mg by mouth every morning.      . metFORMIN (GLUCOPHAGE) 1000 MG tablet Take 1,000 mg by mouth 2 (two) times daily with a meal.      . naproxen (NAPROSYN) 500 MG tablet Take 500 mg by mouth 2 (two) times daily with a meal.      . omeprazole (PRILOSEC) 20 MG capsule Take 20 mg by mouth 2 (two) times daily.       . simvastatin (ZOCOR) 20 MG tablet Take 20 mg by mouth at bedtime.      . [DISCONTINUED] aspirin EC 81 MG tablet Take 81 mg by mouth at bedtime.        Home: Home Living Family/patient expects to be discharged to:: Inpatient rehab Living Arrangements: Spouse/significant other   Functional History: Prior Function Comments: Able to walk several hundred feet with cane prior to having to rest  Functional Status:  Mobility:     Ambulation/Gait Ambulation Distance (  Feet): 28 Feet Gait velocity: decr General Gait Details: cues for posture, sequence and position from RW    ADL:    Cognition: Cognition Overall Cognitive Status: Within Functional Limits for tasks assessed Orientation Level: Oriented X4 Cognition Arousal/Alertness: Awake/alert Behavior During Therapy: WFL for tasks assessed/performed Overall Cognitive Status: Within Functional Limits for tasks assessed  Physical Exam: Blood pressure 159/71, pulse 83, temperature 98.4 F (36.9 C), temperature source Oral, resp. rate 16, height  5' 9"  (1.753 m), weight 255 lb (115.667 kg), SpO2 99.00%. Physical Exam Constitutional: He is oriented to person, place, and time. He appears well-developed.  HENT: oral mucosa pink and moist. Dentition good Head: Normocephalic.  Eyes: EOM are normal.  Neck: Normal range of motion. Neck supple. No thyromegaly present.  Cardiovascular: Normal rate and regular rhythm. No murmurs, rubs, or gallops Respiratory: Effort normal and breath sounds normal. No respiratory distress. No wheezes, rales, or rhonchi GI:  . Bowel sounds are normal. He exhibits mild distension. Non-tender    Skin:  Bilateral knee incisions clean and dry and appropriately tender. Mild edema surrounding area extremities show 1+ pedal edema.  Normal dorsalis pedis pulses  Neuro: alert and oriented. CN nerves normal. sensation is decreased to light touch and proprioception in both feet  Upper extremity strength is 5/5 bilateral deltoid, bicep, tricep, grip  Lower ext strength is 0 in the hip flexors knee extensors, 4/5 ankle dorsiflexors plantar flexors  Results for orders placed during the hospital encounter of 06/11/13 (from the past 48 hour(s))  GLUCOSE, CAPILLARY     Status: Abnormal   Collection Time    06/11/13  1:24 PM      Result Value Ref Range   Glucose-Capillary 100 (*) 70 - 99 mg/dL  GLUCOSE, CAPILLARY     Status: Abnormal   Collection Time    06/11/13  3:32 PM      Result Value Ref Range   Glucose-Capillary 150 (*) 70 - 99 mg/dL  GLUCOSE, CAPILLARY     Status: Abnormal   Collection Time    06/11/13  6:01 PM      Result Value Ref Range   Glucose-Capillary 160 (*) 70 - 99 mg/dL  GLUCOSE, CAPILLARY     Status: Abnormal   Collection Time    06/11/13 10:09 PM      Result Value Ref Range   Glucose-Capillary 229 (*) 70 - 99 mg/dL  CBC     Status: Abnormal   Collection Time    06/12/13  4:45 AM      Result Value Ref Range   WBC 12.2 (*) 4.0 - 10.5 K/uL   RBC 3.39 (*) 4.22 - 5.81 MIL/uL   Hemoglobin 9.2  (*) 13.0 - 17.0 g/dL   Comment: REPEATED TO VERIFY     DELTA CHECK NOTED   HCT 27.1 (*) 39.0 - 52.0 %   MCV 79.9  78.0 - 100.0 fL   MCH 27.1  26.0 - 34.0 pg   MCHC 33.9  30.0 - 36.0 g/dL   RDW 13.2  11.5 - 15.5 %   Platelets 181  150 - 400 K/uL  BASIC METABOLIC PANEL     Status: Abnormal   Collection Time    06/12/13  4:45 AM      Result Value Ref Range   Sodium 136 (*) 137 - 147 mEq/L   Potassium 4.6  3.7 - 5.3 mEq/L   Chloride 100  96 - 112 mEq/L   CO2 24  19 - 32  mEq/L   Glucose, Bld 215 (*) 70 - 99 mg/dL   BUN 28 (*) 6 - 23 mg/dL   Creatinine, Ser 0.97  0.50 - 1.35 mg/dL   Calcium 8.5  8.4 - 10.5 mg/dL   GFR calc non Af Amer 82 (*) >90 mL/min   GFR calc Af Amer >90  >90 mL/min   Comment: (NOTE)     The eGFR has been calculated using the CKD EPI equation.     This calculation has not been validated in all clinical situations.     eGFR's persistently <90 mL/min signify possible Chronic Kidney     Disease.  PROTIME-INR     Status: None   Collection Time    06/12/13  4:45 AM      Result Value Ref Range   Prothrombin Time 14.4  11.6 - 15.2 seconds   INR 1.14  0.00 - 1.49  GLUCOSE, CAPILLARY     Status: Abnormal   Collection Time    06/12/13  7:17 AM      Result Value Ref Range   Glucose-Capillary 179 (*) 70 - 99 mg/dL  GLUCOSE, CAPILLARY     Status: Abnormal   Collection Time    06/12/13 11:51 AM      Result Value Ref Range   Glucose-Capillary 141 (*) 70 - 99 mg/dL  GLUCOSE, CAPILLARY     Status: Abnormal   Collection Time    06/12/13  4:17 PM      Result Value Ref Range   Glucose-Capillary 200 (*) 70 - 99 mg/dL  GLUCOSE, CAPILLARY     Status: Abnormal   Collection Time    06/12/13  9:21 PM      Result Value Ref Range   Glucose-Capillary 228 (*) 70 - 99 mg/dL  CBC     Status: Abnormal   Collection Time    06/13/13  5:10 AM      Result Value Ref Range   WBC 13.0 (*) 4.0 - 10.5 K/uL   RBC 2.99 (*) 4.22 - 5.81 MIL/uL   Hemoglobin 8.2 (*) 13.0 - 17.0 g/dL   HCT  23.5 (*) 39.0 - 52.0 %   MCV 78.6  78.0 - 100.0 fL   MCH 27.4  26.0 - 34.0 pg   MCHC 34.9  30.0 - 36.0 g/dL   RDW 13.3  11.5 - 15.5 %   Platelets 163  150 - 400 K/uL  BASIC METABOLIC PANEL     Status: Abnormal   Collection Time    06/13/13  5:10 AM      Result Value Ref Range   Sodium 138  137 - 147 mEq/L   Potassium 4.3  3.7 - 5.3 mEq/L   Chloride 103  96 - 112 mEq/L   CO2 24  19 - 32 mEq/L   Glucose, Bld 197 (*) 70 - 99 mg/dL   BUN 25 (*) 6 - 23 mg/dL   Creatinine, Ser 0.80  0.50 - 1.35 mg/dL   Calcium 8.7  8.4 - 10.5 mg/dL   GFR calc non Af Amer 89 (*) >90 mL/min   GFR calc Af Amer >90  >90 mL/min   Comment: (NOTE)     The eGFR has been calculated using the CKD EPI equation.     This calculation has not been validated in all clinical situations.     eGFR's persistently <90 mL/min signify possible Chronic Kidney     Disease.  PROTIME-INR     Status: None  Collection Time    06/13/13  5:10 AM      Result Value Ref Range   Prothrombin Time 13.9  11.6 - 15.2 seconds   INR 1.09  0.00 - 1.49  GLUCOSE, CAPILLARY     Status: Abnormal   Collection Time    06/13/13  7:32 AM      Result Value Ref Range   Glucose-Capillary 150 (*) 70 - 99 mg/dL   Comment 1 Documented in Chart     Comment 2 Notify RN    GLUCOSE, CAPILLARY     Status: Abnormal   Collection Time    06/13/13 11:49 AM      Result Value Ref Range   Glucose-Capillary 154 (*) 70 - 99 mg/dL   Comment 1 Documented in Chart     Comment 2 Notify RN     No results found.  Post Admission Physician Evaluation: 1. 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 2. Patient is admitted to receive collaborative, interdisciplinary care between the physiatrist, rehab nursing staff, and therapy team. 3. Patient's level of medical complexity and substantial therapy needs in context of that medical necessity cannot be provided at a lesser intensity of care such as a SNF. 4. Patient has experienced  substantial functional loss from his/her baseline which was documented above under the "Functional History" and "Functional Status" headings.  Judging by the patient's diagnosis, physical exam, and functional history, the patient has potential for functional progress which will result in measurable gains while on inpatient rehab.  These gains will be of substantial and practical use upon discharge  in facilitating mobility and self-care at the household level. 5. Physiatrist will provide 24 hour management of medical needs as well as oversight of the therapy plan/treatment and provide guidance as appropriate regarding the interaction of the two. 6. 24 hour rehab nursing will assist with bladder management, bowel management, safety, skin/wound care, disease management, medication administration, pain management and patient education  and help integrate therapy concepts, techniques,education, etc. 7. PT will assess and treat for/with: Lower extremity strength, range of motion, stamina, balance, functional mobility, safety, adaptive techniques and equipment, knee ROM, knee pain and strength, ortho precautions, .   Goals are: mod I. 8. OT will assess and treat for/with: ADL's, functional mobility, safety, upper extremity strength, adaptive techniques and equipment, pain mgt, ortho precautions, education.   Goals are: mod I. 9. SLP will assess and treat for/with: n/a.  Goals are: n/a. 10. Case Management and Social Worker will assess and treat for psychological issues and discharge planning. 11. Team conference will be held weekly to assess progress toward goals and to determine barriers to discharge. 12. Patient will receive at least 3 hours of therapy per day at least 5 days per week. 13. ELOS: 7 days       14. 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 DVT Prophylaxis/Anticoagulation: Coumadin for DVT prophylaxis. Continue Lovenox  until INR greater than 2.00. Monitor for any bleeding episodes. Check vascular study on admit 3. Pain Management: Oxycodone Robaxin as needed. Monitor with increased mobility  4. Mood/depression: Prozac 20 mg twice a day. Provide emotional support 5. Neuropsych: This patient is capable of making decisions on his own behalf. 6. Acute blood loss anemia. Followup CBC 7. Diabetes mellitus with peripheral neuropathy. Glucophage 1000 mg twice a day, Glucotrol 10 mg twice a day. Check blood sugars a.c. and at  bedtime 8. Hyperlipidemia. Zocor 9. Hypertension. Currently on no antihypertensive medication. Patient on lisinopril 20 mg daily prior to admission. Monitor with increased mobility 10. Sleep apnea. CPAP 11.constipation. Adjust bowel meds/KUB negative for ileus------will need to stay aggressive with bowel meds however as he still has stool in his gut on today's KUB.    Meredith Staggers, MD, [facility]  06/13/2013

## 2013-06-16 NOTE — Progress Notes (Signed)
Subjective: 5 Days Post-Op Procedure(s) (LRB): TOTAL KNEE BILATERAL (Bilateral) Patient reports pain as mild and moderate.   Patient seen in rounds by Dr. Lequita HaltAluisio. Patient is well, but has had some minor complaints of constipation and pain in the knees, requiring pain medications Patient is planning on going to CIR but checking a KUB this morning.  Concern for possible ileus.  If KUB is okay, then allow transfer.    Objective: Vital signs in last 24 hours: Temp:  [97.9 F (36.6 C)-98.4 F (36.9 C)] 98.3 F (36.8 C) (04/13 0434) Pulse Rate:  [92-105] 105 (04/13 0434) Resp:  [16-18] 18 (04/13 0434) BP: (123-158)/(71-84) 123/75 mmHg (04/13 0434) SpO2:  [93 %-95 %] 95 % (04/13 0434)  Intake/Output from previous day:  Intake/Output Summary (Last 24 hours) at 06/16/13 0732 Last data filed at 06/16/13 [id number]  Gross per 24 hour  Intake    600 ml  Output   2351 ml  Net  -1751 ml    Labs:  Recent Labs  06/14/13 0512 06/15/13 0530  HGB 7.9* 9.3*    Recent Labs  06/14/13 0512 06/15/13 0530  WBC 14.0*  --   RBC 2.94*  --   HCT 23.7* 27.5*  PLT 179  --    No results found for this basename: NA, K, CL, CO2, BUN, CREATININE, GLUCOSE, CALCIUM,  in the last 72 hours  Recent Labs  06/15/13 0530 06/16/13 0413  INR 1.60* 1.99*    EXAM: General - Patient is Alert and Appropriate Extremity - Neurovascular intact Sensation intact distally Incision - clean, dry, no drainage, healing Motor Function - intact, moving feet and toes well on exam.   Assessment/Plan: 5 Days Post-Op Procedure(s) (LRB): TOTAL KNEE BILATERAL (Bilateral) Procedure(s) (LRB): TOTAL KNEE BILATERAL (Bilateral) Past Medical History  Diagnosis Date  . Diabetes mellitus without complication   . Hyperlipidemia   . Hypertension   . Sleep apnea     USES CPAP SETTING 14  . Arthritis     KNEES AND SHOULDERS  . Anxiety   . Depression   . H/O diverticulitis of colon   . GERD (gastroesophageal reflux  disease)    Principal Problem:   OA (osteoarthritis) of knee Active Problems:   Postoperative anemia due to acute blood loss  Estimated body mass index is 37.64 kg/(m^2) as calculated from the following:   Height as of this encounter: 5\' 9"  (1.753 m).   Weight as of this encounter: 115.667 kg (255 lb).  DVT Prophylaxis - Lovenox and Coumadin  Weight-Bearing as tolerated to both legs  Take Coumadin for four weeks and then discontinue.  The dose may need to be adjusted based upon the INR.  Please follow the INR and titrate Coumadin dose for a therapeutic range between 2.0 and 3.0 INR.  After completing the four weeks of Coumadin, the patient may stop the Coumadin and resume their 81 mg Aspirin daily at bedtime.  Lovenox injections will start later this evening after the epidural has been removed and continue until the INR is therapeutic at or greater than 2.0.  When INR reaches the therapeutic level of equal to or greater than 2.0, the patient may discontinue the Lovenox injections.  Up with therapy Check KUB. Diet - Cardiac diet and Diabetic diet Follow up - in 2 weeks or following the release from CIR Activity - WBAT to both legs. Disposition - Rehab Condition Upon Discharge - Pending at the time of this note.  Waiting on KUB. D/C Meds -  See DC Summary DVT Prophylaxis - Coumadin  Darryl Chang 06/16/2013, 7:32 AM

## 2013-06-16 NOTE — Progress Notes (Signed)
Rehab admissions - I spoke with patient by phone this afternoon.  He has confirmed that he does want to come [facility].  Bed available and will admit today to inpatient rehab.  Call me for questions.  #[phone]

## 2013-06-16 NOTE — Progress Notes (Signed)
ANTICOAGULATION CONSULT NOTE - Follow Up  Pharmacy Consult for warfarin Indication: VTE prophylaxis  Allergies  Allergen Reactions  . Other     NESTLE CHOCOLATE DRINK - ? COCOA    Patient Measurements: Height: 5\' 9"  (175.3 cm) Weight: 255 lb (115.667 kg) IBW/kg (Calculated) : 70.7  Vital Signs: Temp: 98.3 F (36.8 C) (04/13 0434) Temp src: Oral (04/13 0434) BP: 123/75 mmHg (04/13 0434) Pulse Rate: 105 (04/13 0434)  Labs:  Recent Labs  06/14/13 0512 06/15/13 0530 06/16/13 0413  HGB 7.9* 9.3*  --   HCT 23.7* 27.5*  --   PLT 179  --   --   LABPROT 14.6 18.6* 22.0*  INR 1.16 1.60* 1.99*    Estimated Creatinine Clearance: 109.3 ml/min (by C-G formula based on Cr of 0.8).   Medications:  Prescriptions prior to admission  Medication Sig Dispense Refill  . cholecalciferol (VITAMIN D) 1000 UNITS tablet Take 5,000 Units by mouth every morning.       Marland Kitchen. FLUoxetine (PROZAC) 20 MG tablet Take 20 mg by mouth 2 (two) times daily.      Marland Kitchen. glipiZIDE (GLUCOTROL) 5 MG tablet Take 10 mg by mouth 2 (two) times daily.       Marland Kitchen. lisinopril (PRINIVIL,ZESTRIL) 20 MG tablet Take 20 mg by mouth every morning.      . metFORMIN (GLUCOPHAGE) 1000 MG tablet Take 1,000 mg by mouth 2 (two) times daily with a meal.      . naproxen (NAPROSYN) 500 MG tablet Take 500 mg by mouth 2 (two) times daily with a meal.      . omeprazole (PRILOSEC) 20 MG capsule Take 20 mg by mouth 2 (two) times daily.       . simvastatin (ZOCOR) 20 MG tablet Take 20 mg by mouth at bedtime.      . [DISCONTINUED] aspirin EC 81 MG tablet Take 81 mg by mouth at bedtime.       Scheduled:  . docusate sodium  100 mg Oral BID  . enoxaparin (LOVENOX) injection  30 mg Subcutaneous Q12H  . FLUoxetine  20 mg Oral BID  . glipiZIDE  10 mg Oral BID WC  . insulin aspart  0-15 Units Subcutaneous TID WC  . insulin aspart  0-5 Units Subcutaneous QHS  . metFORMIN  1,000 mg Oral BID WC  . pantoprazole  40 mg Oral BID  . simvastatin  20 mg  Oral QHS  . sodium phosphate  1 enema Rectal Once  . Warfarin - Pharmacist Dosing Inpatient   Does not apply q1800   Inpatient warfarin doses 4/9 - 4/12: 4mg , 7.5mg , 7.5mg , 4mg .  Assessment: Patient s/p TOTAL KNEE BILATERAL (Bilateral) on 4/8.  Warfarin started 4/9 POD1 per protocol at a reduced dose of 4mg  due to indwelling epidural catheter.  Increased to standard initiation regimen after epidural removed on 4/10.  On Lovenox 30 mg SQ q12h  INR nearly therapeutic today  Hgb improved on 4/12 after transfusion on 4/11   Goal of Therapy:  INR 2-3    Plan:   Warfarin 5 mg PO x1 today at 6pm.  D/C Lovenox when INR > 2 per orders from ortho  Daily INR while inpatient  Elie Goodyandy Joaovictor Krone, PharmD, BCPS Pager: [phone] 06/16/2013  8:28 AM

## 2013-06-16 NOTE — Interval H&P Note (Signed)
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  The patient's history has been reviewed, patient examined, and there is no change in status.  Patient continues to be appropriate for intensive inpatient rehabilitation.  I have reviewed the patient's chart and labs.  Questions were answered to the patient's satisfaction.  [name] 06/16/2013, 9:17 PM

## 2013-06-16 NOTE — Progress Notes (Signed)
Pt being [facility] to CIR. [facility] Pt medicated for pain before transfer.

## 2013-06-16 NOTE — Progress Notes (Signed)
Pt was [facility] room 4M3 [facility]. Patient is watching Tv with his family at this time. Denied pain

## 2013-06-16 NOTE — Progress Notes (Signed)
Patient s/p TOTAL KNEE BILATERAL (Bilateral) on 4/8. [facility]  Warfarin started 4/9 POD1 per protocol at a reduced dose of 4mg  due to indwelling epidural catheter. Increased to standard initiation regimen after epidural removed on 4/10.  On Lovenox 30 mg SQ q12h  INR nearly therapeutic today (1.99) Hgb improved on 4/12 after transfusion on 4/11  Goal of Therapy:  INR 2-3   Plan:  Warfarin 5 mg PO x1 today (which has already be given before transfer) D/C Lovenox when INR > 2 per orders from ortho Daily INR while inpatient

## 2013-06-17 ENCOUNTER — Inpatient Hospital Stay (HOSPITAL_COMMUNITY): Payer: Medicare Other

## 2013-06-17 ENCOUNTER — Inpatient Hospital Stay (HOSPITAL_COMMUNITY): Payer: Medicare Other | Admitting: Occupational Therapy

## 2013-06-17 DIAGNOSIS — M7989 Other specified soft tissue disorders: Secondary | ICD-10-CM

## 2013-06-17 LAB — CBC WITH DIFFERENTIAL/PLATELET
BASOS ABS: 0 10*3/uL (ref 0.0–0.1)
BASOS PCT: 0 % (ref 0–1)
Eosinophils Absolute: 0.3 10*3/uL (ref 0.0–0.7)
Eosinophils Relative: 3 % (ref 0–5)
HCT: 28.7 % — ABNORMAL LOW (ref 39.0–52.0)
HEMOGLOBIN: 9.8 g/dL — AB (ref 13.0–17.0)
LYMPHS PCT: 18 % (ref 12–46)
Lymphs Abs: 2 10*3/uL (ref 0.7–4.0)
MCH: 27.6 pg (ref 26.0–34.0)
MCHC: 34.1 g/dL (ref 30.0–36.0)
MCV: 80.8 fL (ref 78.0–100.0)
Monocytes Absolute: 1.7 10*3/uL — ABNORMAL HIGH (ref 0.1–1.0)
Monocytes Relative: 16 % — ABNORMAL HIGH (ref 3–12)
NEUTROS PCT: 63 % (ref 43–77)
Neutro Abs: 6.9 10*3/uL (ref 1.7–7.7)
Platelets: 198 10*3/uL (ref 150–400)
RBC: 3.55 MIL/uL — ABNORMAL LOW (ref 4.22–5.81)
RDW: 14.1 % (ref 11.5–15.5)
WBC: 11 10*3/uL — AB (ref 4.0–10.5)

## 2013-06-17 LAB — GLUCOSE, CAPILLARY
Glucose-Capillary: 155 mg/dL — ABNORMAL HIGH (ref 70–99)
Glucose-Capillary: 129 mg/dL — ABNORMAL HIGH (ref 70–99)
Glucose-Capillary: 128 mg/dL — ABNORMAL HIGH (ref 70–99)
Glucose-Capillary: 140 mg/dL — ABNORMAL HIGH (ref 70–99)

## 2013-06-17 LAB — BASIC METABOLIC PANEL
BUN: 23 mg/dL (ref 6–23)
CO2: 30 mEq/L (ref 19–32)
Calcium: 9.2 mg/dL (ref 8.4–10.5)
Chloride: 94 mEq/L — ABNORMAL LOW (ref 96–112)
Creatinine, Ser: 0.84 mg/dL (ref 0.50–1.35)
GFR, EST NON AFRICAN AMERICAN: 87 mL/min — AB (ref >90)
Glucose, Bld: 172 mg/dL — ABNORMAL HIGH (ref 70–99)
POTASSIUM: 3.9 meq/L (ref 3.7–5.3)
SODIUM: 136 meq/L — AB (ref 137–147)

## 2013-06-17 LAB — PROTIME-INR
INR: 2.42 — ABNORMAL HIGH (ref 0.00–1.49)
Prothrombin Time: 25.5 seconds — ABNORMAL HIGH (ref 11.6–15.2)

## 2013-06-17 MED ORDER — TRAZODONE HCL 50 MG PO TABS
50.0000 mg | ORAL_TABLET | Freq: Every evening | ORAL | Status: DC | PRN
  Administered 2013-06-18: 50 mg via ORAL
  Filled 2013-06-17: qty 1

## 2013-06-17 MED ORDER — OXYCODONE HCL 5 MG PO TABS
5.0000 mg | ORAL_TABLET | ORAL | Status: DC | PRN
  Administered 2013-06-17 – 2013-06-18 (×3): 5 mg via ORAL
  Filled 2013-06-17 (×3): qty 1

## 2013-06-17 MED ORDER — WARFARIN SODIUM 4 MG PO TABS
4.0000 mg | ORAL_TABLET | Freq: Once | ORAL | Status: AC
  Administered 2013-06-17: 4 mg via ORAL
  Filled 2013-06-17: qty 1

## 2013-06-17 NOTE — Progress Notes (Signed)
Patient information reviewed and entered into eRehab system by Annasofia Pohl, RN, CRRN, PPS Coordinator.  Information including medical coding and functional independence measure will be reviewed and updated through discharge.     Per nursing patient was given "Data Collection Information Summary for Patients in Inpatient Rehabilitation Facilities with attached "Privacy Act Statement-Health Care Records" upon admission.  

## 2013-06-17 NOTE — Evaluation (Signed)
Occupational Therapy Assessment and Plan  Patient Details  Name: Darryl Chang MRN: [id number] Date of Birth: 1945/11/22  OT Diagnosis: acute pain, muscle weakness (generalized) and pain in joint Rehab Potential: Rehab Potential: Good ELOS: 7-10 days   Today's Date: 06/17/2013 Time: 0900-1000 Time Calculation (min): 60 min  Problem List:  Patient Active Problem List   Diagnosis Date Noted  . Status post total bilateral knee replacement 06/16/2013  . Postoperative anemia due to acute blood loss 06/13/2013  . OA (osteoarthritis) of knee 06/11/2013    Past Medical History:  Past Medical History  Diagnosis Date  . Diabetes mellitus without complication   . Hyperlipidemia   . Hypertension   . Sleep apnea     USES CPAP SETTING 14  . Arthritis     KNEES AND SHOULDERS  . Anxiety   . Depression   . H/O diverticulitis of colon   . GERD (gastroesophageal reflux disease)    Past Surgical History:  Past Surgical History  Procedure Laterality Date  . Tonsillectomy    . Vasectomy    . Eye surgery      BILATERAL CATARACT EXTRACTION AND LENS IMPLANTS  . Total knee arthroplasty Bilateral 06/11/2013    Procedure: TOTAL KNEE BILATERAL;  Surgeon: Gearlean Alf, MD;  Location: [facility];  Service: Orthopedics;  Laterality: Bilateral;    Assessment & Plan Clinical Impression:  Darryl Chang is a 68 y.o. right-handed male admitted 06/11/2013 with progressive bilateral knee pain left greater than right and no relief with conservative care. Underwent bilateral total knee arthroplasties 06/11/2013 per Dr.Alusio. Postoperative pain management with epidural removed 06/13/2013. Coumadin for DVT prophylaxis. Weightbearing as tolerated bilateral lower extremities. Acute blood loss anemia 7.9 and transfused. Bouts of constipation with mild nausea. A KUB completed 06/16/2013 was negative. Physical and occupational therapy evaluations completed an ongoing with recommendations of physical medicine  rehabilitation consult. M.D. has requested physical medicine rehabilitation consult. Patient was admitted for comprehensive rehabilitation program Patient transferred to CIR on 06/16/2013 .    Patient currently requires max with lower body basic self-care skills secondary to muscle weakness, decreased cardiorespiratoy endurance and decreased standing balance.  Prior to hospitalization, patient was independent with his self care.  Patient will benefit from skilled intervention to increase independence with basic self-care skills prior to discharge home with care partner.  Anticipate patient will achieve a mod I level with his basic self care skills.   OT - End of Session Activity Tolerance: Tolerates 30+ min activity with multiple rests OT Assessment Rehab Potential: Good OT Patient demonstrates impairments in the following area(s): Balance;Endurance;Pain OT Basic ADL's Functional Problem(s): Bathing;Dressing;Toileting OT Transfers Functional Problem(s): Toilet;Tub/Shower OT Additional Impairment(s): None OT Plan OT Intensity: Minimum of 1-2 x/day, 45 to 90 minutes OT Frequency: 5 out of 7 days OT Duration/Estimated Length of Stay: 7-10 days OT Treatment/Interventions: Balance/vestibular training;Discharge planning;DME/adaptive equipment instruction;Functional mobility training;Patient/family education;Self Care/advanced ADL retraining;Therapeutic Exercise;UE/LE Strength taining/ROM;Therapeutic Activities;Pain management OT Self Feeding Anticipated Outcome(s): I OT Basic Self-Care Anticipated Outcome(s): mod I OT Toileting Anticipated Outcome(s): mod I OT Bathroom Transfers Anticipated Outcome(s): mod I OT Recommendation Patient destination: Home Follow Up Recommendations: None Equipment Recommended: None recommended by OT   Skilled Therapeutic Intervention Pt seen for the initial evaluation and ADL retraining with a focus on safe functional mobility of transfers, sit to stand, and  standing balance.  The purpose and role of OT was explained to the patient along with goals. Pt worked on bathing from [place]. He was able to  wash all areas except for feet. Pt provided with a long sponge.  Pt was able to start shorts over feet, but needed A to pull pants up from standing as pt was using all of his effort to maintain standing with RW. He took a few small steps to recliner with RW and descended into recliner slowly and carefully. Pt was grimacing during transfer as he was using his UB to support his weight on the walker to avoid putting weight through his legs.  Pt resting in recliner at end of session with call light in reach.  OT Evaluation Precautions/Restrictions  Precautions Precautions: Knee;Fall Restrictions Weight Bearing Restrictions: No Other Position/Activity Restrictions: WBAT      Pain Pain Assessment Pain Assessment: 0-10 Pain Score: 8  Pain Type: Surgical pain Pain Location: Knee Pain Orientation: Left;Right Pain Descriptors / Indicators: Aching Pain Intervention(s): Repositioned;Distraction  Home Living/Prior Functioning Home Living Family/patient expects to be discharged to:: Private residence Living Arrangements: Spouse/significant other Available Help at Discharge: Family Type of Home: House Home Access: Stairs to enter Technical brewer of Steps: 2 Entrance Stairs-Rails: None Home Layout: One level  Lives With: Spouse (Diane) Prior Function Level of Independence:  (use of SPC)  Able to Take Stairs?: Yes Driving: Yes Vocation: Part time employment ADL ADL ADL Comments: Refer to FIM Vision/Perception  Vision- History Baseline Vision/History: Wears glasses Wears Glasses: At all times Patient Visual Report: No change from baseline Vision- Assessment Vision Assessment?: No apparent visual deficits  Cognition Overall Cognitive Status: Within Functional Limits for tasks assessed Orientation Level: Oriented X4 Attention:  Selective;Alternating Selective Attention: Appears intact Alternating Attention: Appears intact Memory: Appears intact Awareness: Appears intact Problem Solving: Appears intact Safety/Judgment: Appears intact Sensation Sensation Light Touch: Appears Intact Stereognosis: Appears Intact Hot/Cold: Appears Intact Proprioception: Appears Intact Coordination Gross Motor Movements are Fluid and Coordinated: No Fine Motor Movements are Fluid and Coordinated: Yes Coordination and Movement Description: limited due to pain and decreased strength/ROM in B LE Motor  Motor Motor: Within Functional Limits Mobility  Bed Mobility Bed Mobility: Supine to Sit;Sit to Supine  Trunk/Postural Assessment  Cervical Assessment Cervical Assessment: Within Functional Limits Thoracic Assessment Thoracic Assessment: Within Functional Limits Lumbar Assessment Lumbar Assessment: Within Functional Limits Postural Control Postural Control: Within Functional Limits  Balance Balance Balance Assessed: Yes Static Sitting Balance Static Sitting - Level of Assistance: 7: Independent Dynamic Sitting Balance Dynamic Sitting - Level of Assistance: 7: Independent Static Standing Balance Static Standing - Level of Assistance: 3: Mod assist Dynamic Standing Balance Dynamic Standing - Level of Assistance: 2: Max assist Extremity/Trunk Assessment RUE Assessment RUE Assessment: Within Functional Limits LUE Assessment LUE Assessment: Within Functional Limits  FIM:  FIM - Grooming Grooming Steps: Wash, rinse, dry face;Wash, rinse, dry hands Grooming: 5: Set-up assist to obtain items FIM - Bathing Bathing Steps Patient Completed: Chest;Right Arm;Left Arm;Abdomen;Front perineal area;Buttocks;Right upper leg;Left upper leg Bathing: 4: Min-Patient completes 8-9 3f10 parts or 75+ percent FIM - Upper Body Dressing/Undressing Upper body dressing/undressing steps patient completed: Thread/unthread right sleeve of  pullover shirt/dresss;Thread/unthread left sleeve of pullover shirt/dress;Put head through opening of pull over shirt/dress;Pull shirt over trunk Upper body dressing/undressing: 5: Set-up assist to: Obtain clothing/put away FIM - Lower Body Dressing/Undressing Lower body dressing/undressing steps patient completed: Thread/unthread right pants leg;Thread/unthread left pants leg Lower body dressing/undressing: 2: Max-Patient completed 25-49% of tasks FIM - Toileting Toileting: 0: Activity did not occur FIM - BControl and instrumentation engineerDevices: Bed rails;HOB elevated;Walker Bed/Chair Transfer: 5: Supine >  Sit: Supervision (verbal cues/safety issues);3: Bed > Chair or W/C: Mod A (lift or lower assist) FIM - Toilet Transfers Toilet Transfers: 0-Activity did not occur FIM - Tub/Shower Transfers Tub/shower Transfers: 0-Activity did not occur or was simulated   Refer to Care Plan for Long Term Goals  Recommendations for other services: None  Discharge Criteria: Patient will be discharged from OT if patient refuses treatment 3 consecutive times without medical reason, if treatment goals not met, if there is a change in medical status, if patient makes no progress towards goals or if patient is discharged from hospital.  The above assessment, treatment plan, treatment alternatives and goals were discussed and mutually agreed upon: by patient  Harlene Ramus 06/17/2013, 10:52 AM

## 2013-06-17 NOTE — Progress Notes (Signed)
Physical Therapy Session Note  Patient Details  Name: Darryl Chang MRN: [id number] Date of Birth: 07/06/1945  Today's Date: 06/17/2013 Time: 1300-1330 Time Calculation (min): 30 min  Short Term Goals: Week 1:  PT Short Term Goal 1 (Week 1): STGs=LTGs due to anticipated LOS  Skilled Therapeutic Interventions/Progress Updates:  1:1. Pt received semi-reclined in bed, lightly sleeping but easy to wake. Focus this session on functional transfers and w/c propulsion. Pt req (S) for t/f sup>sit EOB w/ use of hospital bed functions. Pt sliding legs to side of bed vs. Lifting. Practiced multiple t/f sit<>stand from elevated bed height as well as from w/c w/ emphasis on safety, seq and consistency. Min A for SPT bed>w/c. Pt propelled w/c 100'x2 and 150' w/ B UE and good tolerance, cues for technique. Pt sitting in w/c at end of session w/ all needs in reach, RN in room.   Therapy Documentation Precautions:  Precautions Precautions: Knee;Fall Required Braces or Orthoses: Knee Immobilizer - Right;Knee Immobilizer - Left Knee Immobilizer - Right: Discontinue once straight leg raise with < 10 degree lag Knee Immobilizer - Left: Discontinue once straight leg raise with < 10 degree lag Restrictions Weight Bearing Restrictions: No Other Position/Activity Restrictions: WBAT  See FIM for current functional status  Therapy/Group: Individual Therapy  Denzil HughesCaroline S Jalilah Wiltsie 06/17/2013, 2:39 PM

## 2013-06-17 NOTE — Progress Notes (Signed)
[place] PHYSICAL MEDICINE & REHABILITATION     PROGRESS NOTE    Subjective/Complaints: Had a fair night. Moved bowels again. Didn't sleep well however  Objective: Vital Signs: Blood pressure 164/81, pulse 99, temperature 98.7 F (37.1 C), temperature source Oral, resp. rate 19, height 5\' 8"  (1.727 m), weight 115.486 kg (254 lb 9.6 oz), SpO2 94.00%. Dg Abd 1 View  06/16/2013   CLINICAL DATA:  Abdominal pain  EXAM: ABDOMEN - 1 VIEW  COMPARISON:  None.  FINDINGS: Scattered large and small bowel gas is noted. No abnormal mass or abnormal calcifications are seen. Degenerative change of the lumbar spine is noted. No free air is seen.  IMPRESSION: No acute abnormality noted.   Electronically Signed   By: Alcide CleverMark  Lukens M.D.   On: 06/16/2013 14:18    Recent Labs  06/15/13 0530 06/17/13 0700  WBC  --  11.0*  HGB 9.3* 9.8*  HCT 27.5* 28.7*  PLT  --  198    Recent Labs  06/16/13 2028  NA 136*  K 3.8  CL 95*  GLUCOSE 159*  BUN 23  CREATININE 0.80  CALCIUM 9.5   CBG (last 3)   Recent Labs  06/16/13 1744 06/16/13 2115 06/17/13 0713  GLUCAP 167* 148* 155*    Wt Readings from Last 3 Encounters:  06/16/13 115.486 kg (254 lb 9.6 oz)  06/11/13 115.667 kg (255 lb)  06/11/13 115.667 kg (255 lb)    Physical Exam:  Constitutional: He is oriented to person, place, and time. He appears well-developed.  HENT: oral mucosa pink and moist. Dentition good  Head: Normocephalic.  Eyes: EOM are normal.  Neck: Normal range of motion. Neck supple. No thyromegaly present.  Cardiovascular: Normal rate and regular rhythm. No murmurs, rubs, or gallops  Respiratory: Effort normal and breath sounds normal. No respiratory distress. No wheezes, rales, or rhonchi  GI: . Bowel sounds are normal. He exhibits mild distension. Non-tender Skin:  Bilateral knee incisions clean and dry and appropriately tender. Mild edema surrounding area  extremities show 1+ pedal edema.  Normal dorsalis pedis  pulses  Neuro: alert and oriented. CN nerves normal. sensation is decreased to light touch and proprioception in both feet  Upper extremity strength is 5/5 bilateral deltoid, bicep, tricep, grip  Lower ext strength is 0 in the hip flexors knee extensors, 4/5 ankle dorsiflexors plantar flexors   Assessment/Plan: 1. Functional deficits secondary to OA of bilateral knees s/p TKA's which require 3+ hours per day of interdisciplinary therapy in a comprehensive inpatient rehab setting. Physiatrist is providing close team supervision and 24 hour management of active medical problems listed below. Physiatrist and rehab team continue to assess barriers to discharge/monitor patient progress toward functional and medical goals. FIM:                   Comprehension Comprehension Mode: Auditory Comprehension: 6-Follows complex conversation/direction: With extra time/assistive device  Expression Expression Mode: Verbal Expression: 6-Expresses complex ideas: With extra time/assistive device  Social Interaction Social Interaction: 6-Interacts appropriately with others with medication or extra time (anti-anxiety, antidepressant).       Medical Problem List and Plan:  1.Bilat TKA secondary to end stage osteoarthritis 06/11/2013  2. DVT Prophylaxis/Anticoagulation: Coumadin for DVT prophylaxis. Continue Lovenox until INR greater than 2.00. Monitor for any bleeding episodes. Check vascular study today 3. Pain Management: Oxycodone Robaxin as needed. Monitor with increased mobility  4. Mood/depression: Prozac 20 mg twice a day. Provide emotional support  5. Neuropsych: This patient  is capable of making decisions on his own behalf.  6. Acute blood loss anemia. Followup CBC  7. Diabetes mellitus with peripheral neuropathy. Glucophage 1000 mg twice a day, Glucotrol 10 mg twice a day. Check blood sugars a.c. and at bedtime   -reasonable control at present 8. Hyperlipidemia. Zocor  9.  Hypertension. Currently on no antihypertensive medication. Patient on lisinopril 20 mg daily prior to admission. Monitor with increased mobility  10. Sleep apnea. CPAP  11.constipation. Adjusted bowel meds/ had bm last night     LOS (Days) 1 A FACE TO FACE EVALUATION WAS PERFORMED  Ranelle OysterZachary T Maliya Marich 06/17/2013 7:39 AM

## 2013-06-17 NOTE — Evaluation (Addendum)
Physical Therapy Assessment and Plan  Patient Details  Name: Darryl Chang MRN: [id number] Date of Birth: 01/21/1946  PT Diagnosis: Difficulty walking, Muscle weakness and Pain in Pain in B LE Rehab Potential: Good ELOS: 7-10days   Today's Date: 06/17/2013 Time: 1030-1130 Time Calculation (min): 60 min  Problem List:  Patient Active Problem List   Diagnosis Date Noted  . Status post total bilateral knee replacement 06/16/2013  . Postoperative anemia due to acute blood loss 06/13/2013  . OA (osteoarthritis) of knee 06/11/2013    Past Medical History:  Past Medical History  Diagnosis Date  . Diabetes mellitus without complication   . Hyperlipidemia   . Hypertension   . Sleep apnea     USES CPAP SETTING 14  . Arthritis     KNEES AND SHOULDERS  . Anxiety   . Depression   . H/O diverticulitis of colon   . GERD (gastroesophageal reflux disease)    Past Surgical History:  Past Surgical History  Procedure Laterality Date  . Tonsillectomy    . Vasectomy    . Eye surgery      BILATERAL CATARACT EXTRACTION AND LENS IMPLANTS  . Total knee arthroplasty Bilateral 06/11/2013    Procedure: TOTAL KNEE BILATERAL;  Surgeon: Gearlean Alf, MD;  Location: [facility];  Service: Orthopedics;  Laterality: Bilateral;    Assessment & Plan Clinical Impression: Darryl Chang is a 68 y.o. right-handed male admitted 06/11/2013 with progressive bilateral knee pain left greater than right and no relief with conservative care. Underwent bilateral total knee arthroplasties 06/11/2013 per Dr.Alusio. Postoperative pain management with epidural removed 06/13/2013. Coumadin for DVT prophylaxis. Weightbearing as tolerated bilateral lower extremities. Acute blood loss anemia 7.9 and transfused. Bouts of constipation with mild nausea. A KUB completed 06/16/2013 was negative. Physical and occupational therapy evaluations completed an ongoing with recommendations of physical medicine rehabilitation consult.  M.D. has requested physical medicine rehabilitation consult. Patient was admitted for comprehensive rehabilitation program. Patient transferred to CIR on 06/16/2013 .   Patient currently requires mod with mobility secondary to muscle weakness, decreased functional endurance and decreased standing balance and decreased balance strategies.  Prior to hospitalization, patient was modified independent  with mobility and lived with Spouse (Diane) in a House home.  Home access is 2Stairs to enter.  Patient will benefit from skilled PT intervention to maximize safe functional mobility, minimize fall risk and decrease caregiver burden for planned discharge home with intermittent assist.  Anticipate patient will benefit from follow up [facility] at discharge.  PT - End of Session Activity Tolerance: Tolerates 10 - 20 min activity with multiple rests Endurance Deficit: Yes PT Assessment Rehab Potential: Good PT Patient demonstrates impairments in the following area(s): Balance;Edema;Endurance;Pain;Safety;Other (comment) (strength) PT Transfers Functional Problem(s): Bed Mobility;Bed to Chair;Car;Furniture PT Locomotion Functional Problem(s): Ambulation;Wheelchair Mobility;Stairs PT Plan PT Intensity: Minimum of 1-2 x/day ,45 to 90 minutes PT Frequency: 5 out of 7 days PT Duration Estimated Length of Stay: 7-10days PT Treatment/Interventions: Ambulation/gait training;DME/adaptive equipment instruction;Stair training;UE/LE Strength taining/ROM;Neuromuscular re-education;Wheelchair propulsion/positioning;UE/LE Coordination activities;Therapeutic Activities;Pain management;Discharge planning;Balance/vestibular training;Cognitive remediation/compensation;Disease management/prevention;Functional mobility training;Patient/family education;Therapeutic Exercise PT Transfers Anticipated Outcome(s): Mod(I)-Supervision PT Locomotion Anticipated Outcome(s): Mod(I)-Supervision PT Recommendation Follow Up Recommendations: Home  health PT Patient destination: Home Equipment Recommended: Rolling walker with 5" wheels  Skilled Therapeutic Intervention 1:1. Pt received sitting in recliner, ready for therapy. PT evaluation performed, see detailed objective information below. Pt educated regarding rehab environment, role of therapies and goals for physical therapy, pt verbalized understanding. Pt stated feeling very fatigued  and "loopy" due to pain medication, req mod cueing for redirection to therapeutic tasks due to fatigue and distraction from internal through ts and external stimuli (cell phone). Assessment of standing mobility limited due to (-) B SLRs and lack of KIs (lost in transfer to unit), PA-C aware and ordered new ones. Tx initiated w/ emphasis on safety during transfers, appropriate positioning in bed and supine therex performed to target B LE strength/ROM w/ fair tolerance. Pt req overall mod A for transfers and bed mobility this session w/ mod cueing. Exercises included 2x10 reps of: ankle pumps, glute sets, quad sets, heel slides, SAQ (assisted) and SLR (assisted 5x each LE). Pt demonstrates poor quad contraction with inability to sustain >1-2 seconds at this time. Unable to assess w/c propulsion due to fatigue. Pt semi-reclined in bed at end of session w/ all needs in reach, bed alarm on.   PT Evaluation Precautions/Restrictions Precautions Precautions: Knee;Fall Required Braces or Orthoses: Knee Immobilizer - Right;Knee Immobilizer - Left Knee Immobilizer - Right: Discontinue once straight leg raise with < 10 degree lag Knee Immobilizer - Left: Discontinue once straight leg raise with < 10 degree lag Restrictions Weight Bearing Restrictions: No Other Position/Activity Restrictions: WBAT General Chart Reviewed: Yes Family/Caregiver Present: No Vital Signs  Pain Pain Assessment Pain Assessment: 0-10 Pain Score: 8  Pain Type: Surgical pain Pain Location: Knee Pain Orientation: Left;Right Pain  Descriptors / Indicators: Aching Pain Intervention(s): Repositioned;Distraction Home Living/Prior Functioning Home Living Available Help at Discharge: Family Type of Home: House Home Access: Stairs to enter Technical brewer of Steps: 2 Entrance Stairs-Rails: None Home Layout: One level  Lives With: Spouse (Diane) Prior Function Level of Independence:  (use of SPC)  Able to Take Stairs?: Yes Driving: Yes Vocation: Part time employment Vision/Perception     Cognition Overall Cognitive Status: Within Functional Limits for tasks assessed Arousal/Alertness: Lethargic Orientation Level: Oriented X4 Attention: Selective;Sustained Sustained Attention: Appears intact Selective Attention: Impaired Selective Attention Impairment: Verbal basic Memory: Appears intact Awareness: Appears intact Problem Solving: Appears intact Safety/Judgment: Appears intact Comments: Pt demonstrating w/ decreased selective attion to therapeutic tasks, easily distracted by internal thoughts or external stimuli. Pt aware of this and feels that it is due to pain medicine. Sensation Sensation Light Touch: Appears Intact Stereognosis: Appears Intact Hot/Cold: Appears Intact Proprioception: Appears Intact Coordination Gross Motor Movements are Fluid and Coordinated: No Fine Motor Movements are Fluid and Coordinated: Yes Coordination and Movement Description: limited due to pain and decreased strength/ROM in B LE Motor  Motor Motor: Within Functional Limits  Mobility Bed Mobility Bed Mobility: Sit to Supine Sit to Supine: 3: Mod assist Sit to Supine - Details: Verbal cues for sequencing;Verbal cues for technique Transfers Transfers: Yes Sit to Stand: 3: Mod assist Sit to Stand Details: Verbal cues for precautions/safety;Verbal cues for technique;Manual facilitation for weight shifting;Verbal cues for sequencing Stand to Sit: 4: Min assist Stand to Sit Details (indicate cue type and reason):  Verbal cues for precautions/safety;Verbal cues for technique;Verbal cues for sequencing Stand Pivot Transfers: 3: Mod assist Stand Pivot Transfer Details: Verbal cues for sequencing;Verbal cues for technique;Verbal cues for precautions/safety Locomotion  Ambulation Ambulation: No (Unable to assess at time of eval, pt w/ (-) SLR and KI not available (lost in transfer to unit)- new ones ordered per PA-C) Stairs / Additional Locomotion Stairs:  (Unsafe/unable to perform at this time) Product manager Mobility: No (Pt declined to perform at time of eval due to fatigue)  Trunk/Postural Assessment  Cervical Assessment Cervical  Assessment: Within Functional Limits Thoracic Assessment Thoracic Assessment: Within Functional Limits Lumbar Assessment Lumbar Assessment: Within Functional Limits Postural Control Postural Control: Within Functional Limits  Balance Balance Balance Assessed: Yes Static Sitting Balance Static Sitting - Balance Support: Bilateral upper extremity supported;Feet supported Static Sitting - Level of Assistance: 7: Independent Dynamic Sitting Balance Dynamic Sitting - Balance Support: Right upper extremity supported;Left upper extremity supported;Feet supported Dynamic Sitting - Level of Assistance: 6: Modified independent (Device/Increase time) Dynamic Sitting - Balance Activities: Lateral lean/weight shifting;Forward lean/weight shifting Static Standing Balance Static Standing - Balance Support: Bilateral upper extremity supported Static Standing - Level of Assistance: 4: Min assist Dynamic Standing Balance Dynamic Standing - Balance Support: Bilateral upper extremity supported Dynamic Standing - Level of Assistance: 3: Mod assist Dynamic Standing - Balance Activities: Lateral lean/weight shifting;Forward lean/weight shifting Extremity Assessment  RUE Assessment RUE Assessment: Within Functional Limits LUE Assessment LUE Assessment: Within Functional  Limits RLE AROM (degrees) RLE Overall AROM Comments: Flex: 80deg; Ext: -16deg RLE Strength RLE Overall Strength Comments: (-) SLR, poor-fair functional strength, dependent upon level of fatigue LLE AROM (degrees) LLE Overall AROM Comments: Flex: 80deg; Ext: -14deg LLE Strength LLE Overall Strength Comments: (-) SLR, poor-fair functional strength, dependent upon level of fatigue  FIM:  FIM - Control and instrumentation engineer Devices: Walker;Arm rests Bed/Chair Transfer: 3: Bed > Chair or W/C: Mod A (lift or lower assist);3: Chair or W/C > Bed: Mod A (lift or lower assist);3: Sit > Supine: Mod A (lifting assist/Pt. 50-74%/lift 2 legs)   Refer to Care Plan for Long Term Goals  Recommendations for other services: None  Discharge Criteria: Patient will be discharged from PT if patient refuses treatment 3 consecutive times without medical reason, if treatment goals not met, if there is a change in medical status, if patient makes no progress towards goals or if patient is discharged from hospital.  The above assessment, treatment plan, treatment alternatives and goals were discussed and mutually agreed upon: by patient  Gilmore Laroche 06/17/2013, 11:44 AM

## 2013-06-17 NOTE — Progress Notes (Signed)
Orthopedic Tech Progress Note Patient Details:  Darryl BandyRichard Chang 1945-09-08 [id number]  CPM Left Knee CPM Left Knee: Off Left Knee Flexion (Degrees): 0 Left Knee Extension (Degrees): 0 CPM Right Knee CPM Right Knee: On Right Knee Flexion (Degrees): 60 Right Knee Extension (Degrees): 0   Jennye Moccasinnthony Craig Coleen Cardiff 06/17/2013, 7:51 PM

## 2013-06-17 NOTE — IPOC Note (Signed)
Overall Plan of Care [facility]IPOC) Patient Details Name: Lowella BandyRichard Bubar MRN: [id number] DOB: 1945/10/13  Admitting Diagnosis: B TKR  Hospital Problems: Active Problems:   Status post total bilateral knee replacement     Functional Problem List: Nursing Pain;Bladder;Bowel;Skin Integrity;Sensory;Safety;Behavior;Medication Management;Edema  PT Balance;Edema;Endurance;Pain;Safety;Other (comment) (strength)  OT Balance;Endurance;Pain  SLP    TR         Basic ADL's: OT Bathing;Dressing;Toileting     Advanced  ADL's: OT       Transfers: PT Bed Mobility;Bed to Chair;Car;Furniture  OT Toilet;Tub/Shower     Locomotion: PT Ambulation;Wheelchair Mobility;Stairs     Additional Impairments: OT None  SLP        TR      Anticipated Outcomes Item Anticipated Outcome  Self Feeding I  Swallowing      Basic self-care  mod I  Toileting  mod I   Bathroom Transfers mod I  Bowel/Bladder  cont of bowel and bladder  Transfers  Mod(I)-Supervision  Locomotion  Mod(I)-Supervision  Communication     Cognition     Pain  less than 2  Safety/Judgment  adhere to safety protocol   Therapy Plan: PT Intensity: Minimum of 1-2 x/day ,45 to 90 minutes PT Frequency: 5 out of 7 days PT Duration Estimated Length of Stay: 7-10days OT Intensity: Minimum of 1-2 x/day, 45 to 90 minutes OT Frequency: 5 out of 7 days OT Duration/Estimated Length of Stay: 7-10 days         Team Interventions: Nursing Interventions Bladder Management;Disease Management/Prevention;Medication Management;Pain Management;Bowel Management;Patient/Family Education;Skin Care/Wound Management;Psychosocial Support;Discharge Planning  PT interventions Ambulation/gait training;DME/adaptive equipment instruction;Stair training;UE/LE Strength taining/ROM;Neuromuscular re-education;Wheelchair propulsion/positioning;UE/LE Coordination activities;Therapeutic Activities;Pain management;Discharge planning;Balance/vestibular  training;Cognitive remediation/compensation;Disease management/prevention;Functional mobility training;Patient/family education;Therapeutic Exercise  OT Interventions Balance/vestibular training;Discharge planning;DME/adaptive equipment instruction;Functional mobility training;Patient/family education;Self Care/advanced ADL retraining;Therapeutic Exercise;UE/LE Strength taining/ROM;Therapeutic Activities;Pain management  SLP Interventions    TR Interventions    SW/CM Interventions      Team Discharge Planning: Destination: PT-Home ,OT- Home , SLP-  Projected Follow-up: PT-Home health PT, OT-  None, SLP-  Projected Equipment Needs: PT-Rolling walker with 5" wheels, OT- None recommended by OT, SLP-  Equipment Details: PT- , OT-  Patient/family involved in discharge planning: PT- Patient,  OT-Patient, SLP-   MD ELOS: 7-10 days Medical Rehab Prognosis:  Excellent Assessment: The patient has been admitted for CIR therapies. The team will be addressing functional mobility, strength, stamina, balance, safety, adaptive techniques and equipment, self-care, bowel and bladder mgt, patient and caregiver education, pain mgt, knee rom, quad strengthening, activity tolerance. Goals have been set at mod I for mobility and self-care.    Ranelle OysterZachary T. Swartz, MD, FAAPMR      See Team Conference Notes for weekly updates to the plan of care

## 2013-06-17 NOTE — Progress Notes (Signed)
ANTICOAGULATION CONSULT NOTE - Follow Up Consult  Pharmacy Consult for Coumadin Indication: VTE prophylaxis  Allergies  Allergen Reactions  . Other     NESTLE CHOCOLATE DRINK - ? COCOA    Patient Measurements: Height: 5\' 8"  (172.7 cm) Weight: 254 lb 9.6 oz (115.486 kg) IBW/kg (Calculated) : 68.4  Vital Signs: Temp: 98.7 F (37.1 C) (04/14 0551) Temp src: Oral (04/14 0551) BP: 164/81 mmHg (04/14 0551) Pulse Rate: 99 (04/14 0551)  Labs:  Recent Labs  06/15/13 0530 06/16/13 0413 06/16/13 2028 06/17/13 0700  HGB 9.3*  --   --  9.8*  HCT 27.5*  --   --  28.7*  PLT  --   --   --  198  LABPROT 18.6* 22.0*  --  25.5*  INR 1.60* 1.99*  --  2.42*  CREATININE  --   --  0.80 0.84    Estimated Creatinine Clearance: 102.4 ml/min (by C-G formula based on Cr of 0.84).  Assessment: [age] YOM s/p B/L TKA on 4/8, on coumadin for VTE prophylaxis. INR (2.42) up in therapeutic range this morning. CBC stable, No bleeding noted per chart.  Goal of Therapy:  INR 2-3 Monitor platelets by anticoagulation protocol: Yes   Plan:  Warfarin 4 mg PO x1 today  Daily INR while inpatient  Bayard HuggerMei Yakima Kreitzer, PharmD, BCPS  Clinical Pharmacist  Pager: [phone]   06/17/2013,11:23 AM

## 2013-06-17 NOTE — Progress Notes (Signed)
VASCULAR LAB PRELIMINARY  PRELIMINARY  PRELIMINARY  PRELIMINARY  Bilateral lower extremity venous duplex completed.    Preliminary report: Technically difficult and limited exam.  No obvious deep or superficial vein thrombosis bilaterally.   Vanna ScotlandFrances G Mionna Advincula, RVT 06/17/2013, 12:30 PM

## 2013-06-17 NOTE — Progress Notes (Signed)
Physical Therapy Note  Patient Details  Name: Darryl Chang MRN: [id number] Date of Birth: [birth year] Today's Date: 06/17/2013  Time: 1330-1410 Pt denied pain while seated in w/c Individual Therapy  Pt seated in w/c upon arrival.  Focus on discharge planning, home layout, and safety awareness.  Pt exhibited difficulty maintaining topic during conversation.  Pt stated that he was still experiencing effects of medications and described double vision and hallucinations.    Darryl Chang 06/17/2013, 2:47 PM

## 2013-06-18 ENCOUNTER — Encounter (HOSPITAL_COMMUNITY): Payer: Medicare Other

## 2013-06-18 ENCOUNTER — Inpatient Hospital Stay (HOSPITAL_COMMUNITY): Payer: Medicare Other

## 2013-06-18 DIAGNOSIS — M1710 Unilateral primary osteoarthritis, unspecified knee: Secondary | ICD-10-CM

## 2013-06-18 DIAGNOSIS — Z96659 Presence of unspecified artificial knee joint: Secondary | ICD-10-CM

## 2013-06-18 DIAGNOSIS — IMO0002 Reserved for concepts with insufficient information to code with codable children: Secondary | ICD-10-CM

## 2013-06-18 LAB — GLUCOSE, CAPILLARY
GLUCOSE-CAPILLARY: 125 mg/dL — AB (ref 70–99)
Glucose-Capillary: 154 mg/dL — ABNORMAL HIGH (ref 70–99)
Glucose-Capillary: 188 mg/dL — ABNORMAL HIGH (ref 70–99)
Glucose-Capillary: 145 mg/dL — ABNORMAL HIGH (ref 70–99)

## 2013-06-18 LAB — URINALYSIS, ROUTINE W REFLEX MICROSCOPIC
Glucose, UA: NEGATIVE mg/dL
Hgb urine dipstick: NEGATIVE
Ketones, ur: 40 mg/dL — AB
LEUKOCYTES UA: NEGATIVE
NITRITE: NEGATIVE
PROTEIN: NEGATIVE mg/dL
SPECIFIC GRAVITY, URINE: 1.02 (ref 1.005–1.030)
Urobilinogen, UA: 1 mg/dL (ref 0.0–1.0)
pH: 6 (ref 5.0–8.0)

## 2013-06-18 LAB — PROTIME-INR
INR: 3.02 — ABNORMAL HIGH (ref 0.00–1.49)
Prothrombin Time: 30.2 seconds — ABNORMAL HIGH (ref 11.6–15.2)

## 2013-06-18 MED ORDER — WARFARIN SODIUM 2 MG PO TABS
2.0000 mg | ORAL_TABLET | Freq: Once | ORAL | Status: AC
  Administered 2013-06-18: 2 mg via ORAL
  Filled 2013-06-18: qty 1

## 2013-06-18 MED ORDER — POLYETHYLENE GLYCOL 3350 17 G PO PACK
17.0000 g | PACK | Freq: Every day | ORAL | Status: DC
  Administered 2013-06-18 – 2013-06-21 (×4): 17 g via ORAL
  Filled 2013-06-18 (×7): qty 1

## 2013-06-18 MED ORDER — SIMETHICONE 40 MG/0.6ML PO SUSP
40.0000 mg | Freq: Four times a day (QID) | ORAL | Status: DC
  Administered 2013-06-18 – 2013-06-23 (×13): 40 mg via ORAL
  Filled 2013-06-18 (×31): qty 0.6

## 2013-06-18 MED ORDER — ZOLPIDEM TARTRATE 5 MG PO TABS
5.0000 mg | ORAL_TABLET | Freq: Every day | ORAL | Status: DC
  Administered 2013-06-18 – 2013-06-22 (×5): 5 mg via ORAL
  Filled 2013-06-18 (×5): qty 1

## 2013-06-18 MED ORDER — TRAMADOL HCL 50 MG PO TABS
50.0000 mg | ORAL_TABLET | Freq: Four times a day (QID) | ORAL | Status: DC | PRN
  Administered 2013-06-18 – 2013-06-25 (×22): 50 mg via ORAL
  Filled 2013-06-18 (×23): qty 1

## 2013-06-18 NOTE — Progress Notes (Signed)
@  2010 spoke with pt about what time he wanted to go on CPAP. Pt stated he could place himself on and off but was going to put it on around midnight.   @0018  came to see if pt needed any assistance with CPAP. He had CPAP on with previous settings, 14 cm H2O, and home mask and tubing. He stated he did not need anything at this time from me but asked if I would let the RN know he needed pain medicine. RN made aware. RT encouraged pt to call if he needs any further assistance with his CPAP. RT will continue to monitor.

## 2013-06-18 NOTE — Patient Care Conference (Signed)
Inpatient RehabilitationTeam Conference and Plan of Care Update Date: 06/17/2013   Time: 3:10 PM    Patient Name: Darryl BandyRichard Chang      Medical Record Number: [id number]  Date of Birth: 09-17-1945 Sex: Male         Room/Bed: 4M03C/4M03C-01 Payor Info: Payor: MEDICARE / Plan: MEDICARE PART A AND B / Product Type: *No Product type* /    Admitting Diagnosis: B TKR  Admit Date/Time:  06/16/2013  6:40 PM Admission Comments: No comment available   Primary Diagnosis:  <principal problem not specified> Principal Problem: <principal problem not specified>  Patient Active Problem List   Diagnosis Date Noted  . Status post total bilateral knee replacement 06/16/2013  . Postoperative anemia due to acute blood loss 06/13/2013  . OA (osteoarthritis) of knee 06/11/2013    Expected Discharge Date: Expected Discharge Date: 06/25/13  Team Members Present: Physician leading conference: Dr. Faith RogueZachary Swartz Social Worker Present: Amada JupiterLucy Larren Copes, LCSW Nurse Present: Phylliss BobKom Avegno, RN PT Present: Zerita Boersaroline King, PT OT Present: Ardis Rowanom Lanier, Corky CraftsOTA;Karen Pulaski, OT SLP Present: Feliberto Gottronourtney Payne, SLP PPS Coordinator present : Tora DuckMarie Noel, RN, CRRN;Becky Henrene DodgeWindsor, PT     Current Status/Progress Goal Weekly Team Focus  Medical   BILATERAL KNEE REPLACEMENTS. MORE CONFUSION TODAY. LIKELY MED EFFECT. BOWELS MOVING  STABILIZE MEDICALLY FOR DC  PAIN MGT, COGNITIVE MGT   Bowel/Bladder   Cont of bowel and bladder  remain cont of bowel and bladder  remain cont of bowel and bladder   Swallow/Nutrition/ Hydration             ADL's   max A LB dressing, min bathing, mod transfers with RW  Mod I for basic self care  ADL retraining, functional mobility training, pt education   Mobility   Mod A for transfers and bed mobility  Mod(I)-Supervision  funcitonal endurance, B LE strength, knee ROM, ambulation, stair negotiation   Communication             Safety/Cognition/ Behavioral Observations            Pain   denied pain  at this time   less than 2  assess for pain q shift   Skin   incision to R and L knee with steril strips  no other skin breakdown  free of any skin brakdown    Rehab Goals Patient on target to meet rehab goals: Yes *See Care Plan and progress notes for long and short-term goals.  Barriers to Discharge: QUAD WEAKNESS, INTERMITTENT CONFUSION    Possible Resolutions to Barriers:  DECREASE PAIN MEDS/ FAMILY ED    Discharge Planning/Teaching Needs:  home with wife available to provide 24/7 assistance      Team Discussion:  Still awaiting KI to arrive so still have not done any standing exercises.  New eval.  Some mental status issues to day likely due to pain meds. Setting mod i goals overall  Revisions to Treatment Plan:  None   Continued Need for Acute Rehabilitation Level of Care: The patient requires daily medical management by a physician with specialized training in physical medicine and rehabilitation for the following conditions: Daily direction of a multidisciplinary physical rehabilitation program to ensure safe treatment while eliciting the highest outcome that is of practical value to the patient.: Yes Daily medical management of patient stability for increased activity during participation in an intensive rehabilitation regime.: Yes Daily analysis of laboratory values and/or radiology reports with any subsequent need for medication adjustment of medical intervention for : Neurological  problems;Post surgical problems  Amada JupiterLucy Tereza Gilham 06/18/2013, 4:07 PM

## 2013-06-18 NOTE — Progress Notes (Signed)
Social Work Patient ID: Darryl Bandyichard Merlin, male   DOB: 1945/03/10, 68 y.o.   MRN: [id number]  Amada JupiterLucy Rachell Druckenmiller, LCSW Social Worker Signed  Patient Care Conference Service date: 06/18/2013 11:11 AM  Inpatient RehabilitationTeam Conference and Plan of Care Update Date: 06/17/2013   Time: 3:10 PM     Patient Name: Darryl Chang       Medical Record Number: [id number]   Date of Birth: 1945/03/10 Sex: Male         Room/Bed: 4M03C/4M03C-01 Payor Info: Payor: MEDICARE / Plan: MEDICARE PART A AND B / Product Type: *No Product type* /   Admitting Diagnosis: B TKR   Admit Date/Time:  06/16/2013  6:40 PM Admission Comments: No comment available   Primary Diagnosis:  <principal problem not specified> Principal Problem: <principal problem not specified>    Patient Active Problem List     Diagnosis  Date Noted   .  Status post total bilateral knee replacement  06/16/2013   .  Postoperative anemia due to acute blood loss  06/13/2013   .  OA (osteoarthritis) of knee  06/11/2013     Expected Discharge Date: Expected Discharge Date: 06/25/13  Team Members Present: Physician leading conference: Dr. Faith RogueZachary Swartz Social Worker Present: Amada JupiterLucy Brownie Gockel, LCSW Nurse Present: Phylliss BobKom Avegno, RN PT Present: Zerita Boersaroline King, PT OT Present: Ardis Rowanom Lanier, Corky CraftsOTA;Karen Pulaski, OT SLP Present: Feliberto Gottronourtney Payne, SLP PPS Coordinator present : Tora DuckMarie Noel, RN, CRRN;Becky Henrene DodgeWindsor, PT        Current Status/Progress  Goal  Weekly Team Focus   Medical     BILATERAL KNEE REPLACEMENTS. MORE CONFUSION TODAY. LIKELY MED EFFECT. BOWELS MOVING  STABILIZE MEDICALLY FOR DC  PAIN MGT, COGNITIVE MGT   Bowel/Bladder     Cont of bowel and bladder  remain cont of bowel and bladder  remain cont of bowel and bladder   Swallow/Nutrition/ Hydration            ADL's     max A LB dressing, min bathing, mod transfers with RW  Mod I for basic self care  ADL retraining, functional mobility training, pt education   Mobility     Mod A for  transfers and bed mobility  Mod(I)-Supervision  funcitonal endurance, B LE strength, knee ROM, ambulation, stair negotiation   Communication            Safety/Cognition/ Behavioral Observations           Pain     denied pain at this time   less than 2  assess for pain q shift   Skin     incision to R and L knee with steril strips  no other skin breakdown  free of any skin brakdown    Rehab Goals Patient on target to meet rehab goals: Yes *See Care Plan and progress notes for long and short-term goals.    Barriers to Discharge:  QUAD WEAKNESS, INTERMITTENT CONFUSION      Possible Resolutions to Barriers:    DECREASE PAIN MEDS/ FAMILY ED      Discharge Planning/Teaching Needs:    home with wife available to provide 24/7 assistance      Team Discussion:    Still awaiting KI to arrive so still have not done any standing exercises.  New eval.  Some mental status issues to day likely due to pain meds. Setting mod i goals overall   Revisions to Treatment Plan:    None    Continued Need for Acute Rehabilitation Level of Care: The  patient requires daily medical management by a physician with specialized training in physical medicine and rehabilitation for the following conditions: Daily direction of a multidisciplinary physical rehabilitation program to ensure safe treatment while eliciting the highest outcome that is of practical value to the patient.: Yes Daily medical management of patient stability for increased activity during participation in an intensive rehabilitation regime.: Yes Daily analysis of laboratory values and/or radiology reports with any subsequent need for medication adjustment of medical intervention for : Neurological problems;Post surgical problems  Amada JupiterLucy Shannia Jacuinde 06/18/2013, 4:07 PM

## 2013-06-18 NOTE — Progress Notes (Signed)
Orthopedic Tech Progress Note Patient Details:  Darryl BandyRichard Chang 10/28/1945 [id number]  Patient ID: Darryl Bandyichard Chang, male   DOB: 10/28/1945, 68 y.o.   MRN: [id number] Placed pt's lle in cpm @ 0-40 degrees @ 1840   Maddison Kilner 06/18/2013, 6:43 PM

## 2013-06-18 NOTE — Progress Notes (Signed)
Social Work Patient ID: Lowella Bandyichard Vaquera, male   DOB: 12/06/1945, 68 y.o.   MRN: [id number]   Have reviewed team conference with pt who is aware and agreeable with targeted d/c date of 4/22 with mod i goals overall.  Wife and daughter in today and expressing some concerns - requested that Bayard HuggerSteve Marshall, RN Director follow up with them all.  Pt confirms they were able to speak with him and feel their concerns were resolved.  Will continue to follow for support and d/c planning.  Amada JupiterLucy Yerick Eggebrecht, LCSW

## 2013-06-18 NOTE — Progress Notes (Signed)
Social Work Social Work Assessment and Plan  Patient Details  Name: Darryl Chang MRN: [id number] Date of Birth: 06-Feb-1946  Today's Date: 06/18/2013  Problem List:  Patient Active Problem List   Diagnosis Date Noted  . Status post total bilateral knee replacement 06/16/2013  . Postoperative anemia due to acute blood loss 06/13/2013  . OA (osteoarthritis) of knee 06/11/2013   Past Medical History:  Past Medical History  Diagnosis Date  . Diabetes mellitus without complication   . Hyperlipidemia   . Hypertension   . Sleep apnea     USES CPAP SETTING 14  . Arthritis     KNEES AND SHOULDERS  . Anxiety   . Depression   . H/O diverticulitis of colon   . GERD (gastroesophageal reflux disease)    Past Surgical History:  Past Surgical History  Procedure Laterality Date  . Tonsillectomy    . Vasectomy    . Eye surgery      BILATERAL CATARACT EXTRACTION AND LENS IMPLANTS  . Total knee arthroplasty Bilateral 06/11/2013    Procedure: TOTAL KNEE BILATERAL;  Surgeon: Darryl DrillingFrank V [name], MD;  Location: [facility];  Service: Orthopedics;  Laterality: Bilateral;   Social History:  reports that he has quit smoking. His smoking use included Cigarettes. He has a 15 pack-year smoking history. He does not have any smokeless tobacco history on file. He reports that he does not drink alcohol or use illicit drugs.  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  Pt very focused on his decision to come to CIR vs to a SNF closer to home.  Admits he was initally upset by having to choose but now hopeful that he "made the right choice.Marland Kitchen.Marland Kitchen.I just want to do what I can to get home."  Denies any s/s of depression or anxiety - will monitor Recent Psychosocial Issues: None Pyschiatric History: None Substance Abuse History: None  Patient / Family Perceptions, Expectations & Goals Pt/Family understanding of illness & functional limitations: pt and family with good, general understanding of the surgery performed and his current functional limitations/ need for CIR Premorbid pt/family roles/activities: pt was independent overall;  out the home with work on his schedule Anticipated changes in roles/activities/participation: wife may need to assume some caregiver responsibilities initally until pt fully reaches independence Pt/family expectations/goals: "I just want to do what I can and get home.  My wife has to drive up here and I don't like that."  [employer] Community Agencies: None Premorbid Home Care/DME Agencies: None Transportation available  at discharge: yes  Discharge Planning Living Arrangements: Spouse/significant other Support Systems: Spouse/significant other;Children Type of Residence: Private residence Insurance Resources:  Medicare;Private Insurance (specify) ([employer]) Financial Resources: Social Doctor, hospitalecurity;Employment Financial Screen Referred: No Living Expenses: Own Money Management: Patient Does the patient have any problems obtaining your medications?: No Home Management: pt and wife Patient/Family Preliminary Plans: pt plans to return home with wife.  Has already chosen where he would like to do his follow up therapies. Social Work Anticipated Follow Up Needs: HH/OP Expected length of stay: 7-10 days  Clinical Impression Pleasant, motivated gentleman here follow bil TKR.  Good family support and hopes to have short LOS.  Eager to get home to decrease travel burden on wife.  Will follow for support and d/c planning.  [name] 06/18/2013, 4:06 PM

## 2013-06-18 NOTE — Progress Notes (Addendum)
Physical Therapy Session Note  Patient Details  Name: Lowella BandyRichard Nancarrow MRN: [id number] Date of Birth: 12/09/1945  Today's Date: 06/18/2013 Time: [id number] Time Calculation (min): 30 min  Short Term Goals: Week 1:  PT Short Term Goal 1 (Week 1): STGs=LTGs due to anticipated LOS  Skilled Therapeutic Interventions/Progress Updates:  Pt received semi-reclined in bed, mod A for t/f sup>sit EOB w/ use of hospital bed functions and min A for SPT bed>w/c. Pt participated in ortho therex group. Pt propelled w/c 150'x2 to target B UE strength/endurance. Pt performed seated B knee flexion (alternated w/ 5sec hold, 5x each LE), gravity assisted B knee ext and quad sets (2x10reps) w/ B LE placed on 6" block to address B LE strength/ROM. Pt w/ overall good tolerance. Pt sitting in w/c at end of session w/ all needs in reach.   Therapy Documentation Precautions:  Precautions Precautions: Knee;Fall Required Braces or Orthoses: Knee Immobilizer - Right;Knee Immobilizer - Left Knee Immobilizer - Right: Discontinue once straight leg raise with < 10 degree lag Knee Immobilizer - Left: Discontinue once straight leg raise with < 10 degree lag Restrictions Weight Bearing Restrictions: No Other Position/Activity Restrictions: WBAT  See FIM for current functional status  Therapy/Group: Individual Therapy  Denzil HughesCaroline S Chancellor Vanderloop 06/18/2013, 4:20 PM

## 2013-06-18 NOTE — Progress Notes (Signed)
[place] PHYSICAL MEDICINE & REHABILITATION     PROGRESS NOTE    Subjective/Complaints: Confused yesterday. Didn't sleep well. Didn't realize trazodone was for sleep.  A 12 point review of systems has been performed and if not noted above is otherwise negative.   Objective: Vital Signs: Blood pressure 154/78, pulse 98, temperature 98 F (36.7 C), temperature source Oral, resp. rate 19, height 5\' 8"  (1.727 m), weight 115.486 kg (254 lb 9.6 oz), SpO2 94.00%. Dg Abd 1 View  06/16/2013   CLINICAL DATA:  Abdominal pain  EXAM: ABDOMEN - 1 VIEW  COMPARISON:  None.  FINDINGS: Scattered large and small bowel gas is noted. No abnormal mass or abnormal calcifications are seen. Degenerative change of the lumbar spine is noted. No free air is seen.  IMPRESSION: No acute abnormality noted.   Electronically Signed   By: Alcide CleverMark  Lukens M.D.   On: 06/16/2013 14:18    Recent Labs  06/17/13 0700  WBC 11.0*  HGB 9.8*  HCT 28.7*  PLT 198    Recent Labs  06/16/13 2028 06/17/13 0700  NA 136* 136*  K 3.8 3.9  CL 95* 94*  GLUCOSE 159* 172*  BUN 23 23  CREATININE 0.80 0.84  CALCIUM 9.5 9.2   CBG (last 3)   Recent Labs  06/17/13 1642 06/17/13 2112 06/18/13 0725  GLUCAP 128* 140* 125*    Wt Readings from Last 3 Encounters:  06/16/13 115.486 kg (254 lb 9.6 oz)  06/11/13 115.667 kg (255 lb)  06/11/13 115.667 kg (255 lb)    Physical Exam:  Constitutional: He is oriented to person, place, and time. He appears well-developed.  HENT: oral mucosa pink and moist. Dentition good  Head: Normocephalic.  Eyes: EOM are normal.  Neck: Normal range of motion. Neck supple. No thyromegaly present.  Cardiovascular: Normal rate and regular rhythm. No murmurs, rubs, or gallops  Respiratory: Effort normal and breath sounds normal. No respiratory distress. No wheezes, rales, or rhonchi  GI: . Bowel sounds are normal. He exhibits mild distension. Non-tender Skin:  Bilateral knee incisions clean and  dry and appropriately tender. Mild edema surrounding area  extremities show 1+ pedal edema.  Normal dorsalis pedis pulses  Neuro: alert and oriented. CN nerves normal. sensation is decreased to light touch and proprioception in both feet  Upper extremity strength is 5/5 bilateral deltoid, bicep, tricep, grip  Lower ext strength is 0 in the hip flexors knee extensors, 4/5 ankle dorsiflexors plantar flexors   Assessment/Plan: 1. Functional deficits secondary to OA of bilateral knees s/p TKA's which require 3+ hours per day of interdisciplinary therapy in a comprehensive inpatient rehab setting. Physiatrist is providing close team supervision and 24 hour management of active medical problems listed below. Physiatrist and rehab team continue to assess barriers to discharge/monitor patient progress toward functional and medical goals. FIM: FIM - Bathing Bathing Steps Patient Completed: Chest;Right Arm;Left Arm;Abdomen;Front perineal area;Buttocks;Right upper leg;Left upper leg Bathing: 4: Min-Patient completes 8-9 4415f 10 parts or 75+ percent  FIM - Upper Body Dressing/Undressing Upper body dressing/undressing steps patient completed: Thread/unthread right sleeve of pullover shirt/dresss;Thread/unthread left sleeve of pullover shirt/dress;Put head through opening of pull over shirt/dress;Pull shirt over trunk Upper body dressing/undressing: 5: Set-up assist to: Obtain clothing/put away FIM - Lower Body Dressing/Undressing Lower body dressing/undressing steps patient completed: Thread/unthread right pants leg;Thread/unthread left pants leg Lower body dressing/undressing: 2: Max-Patient completed 25-49% of tasks  FIM - Toileting Toileting: 0: Activity did not occur  FIM - ArchivistToilet Transfers Toilet  Transfers: 0-Activity did not occur  FIM - BankerBed/Chair Transfer Bed/Chair Transfer Assistive Devices: Walker;Arm rests Bed/Chair Transfer: 3: Bed > Chair or W/C: Mod A (lift or lower assist);3: Chair or  W/C > Bed: Mod A (lift or lower assist);3: Sit > Supine: Mod A (lifting assist/Pt. 50-74%/lift 2 legs)  FIM - Locomotion: Wheelchair Locomotion: Wheelchair: 2: Travels 50 - 149 ft with supervision, cueing or coaxing FIM - Locomotion: Ambulation Locomotion: Ambulation: 0: Activity did not occur (Unable to assess due to lack of KIs, new order placed by PA-C)  Comprehension Comprehension Mode: Auditory Comprehension: 6-Follows complex conversation/direction: With extra time/assistive device  Expression Expression Mode: Verbal Expression: 6-Expresses complex ideas: With extra time/assistive device  Social Interaction Social Interaction: 6-Interacts appropriately with others with medication or extra time (anti-anxiety, antidepressant).  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 DVT Prophylaxis/Anticoagulation: Coumadin for DVT prophylaxis. Continue Lovenox until INR greater than 2.00. Monitor for any bleeding episodes. dopplers neg 3. Pain Management: Oxycodone Robaxin as needed. Monitor with increased mobility   -oxy reduced due to confusion 4. Mood/depression: Prozac 20 mg twice a day. Provide emotional support  5. Neuropsych: This patient is capable of making decisions on his own behalf.  6. Acute blood loss anemia. Followup CBC  7. Diabetes mellitus with peripheral neuropathy. Glucophage 1000 mg twice a day, Glucotrol 10 mg twice a day. Check blood sugars a.c. and at bedtime   -reasonable control at present 8. Hyperlipidemia. Zocor  9. Hypertension. Currently on no antihypertensive medication. Patient on lisinopril 20 mg daily prior to admission. Monitor with increased mobility  10. Sleep apnea. CPAP   -add low dose scheduled  ambien for sleep 11.constipation. Adjusted bowel meds/ had bm last night 12. GERD: double dose protonix, prn maalox     LOS (Days) 2 A FACE TO FACE EVALUATION WAS PERFORMED  Ranelle OysterZachary T Vicki Chaffin 06/18/2013 7:51 AM

## 2013-06-18 NOTE — Progress Notes (Signed)
ANTICOAGULATION CONSULT NOTE - Follow Up Consult  Pharmacy Consult for Coumadin Indication: VTE prophylaxis  Allergies  Allergen Reactions  . Other     NESTLE CHOCOLATE DRINK - ? COCOA    Patient Measurements: Height: 5\' 8"  (172.7 cm) Weight: 254 lb 9.6 oz (115.486 kg) IBW/kg (Calculated) : 68.4  Vital Signs: Temp: 98 F (36.7 C) (04/15 0532) Temp src: Oral (04/15 0532) BP: 154/78 mmHg (04/15 0532) Pulse Rate: 98 (04/15 0532)  Labs:  Recent Labs  06/16/13 0413 06/16/13 2028 06/17/13 0700 06/18/13 0544  HGB  --   --  9.8*  --   HCT  --   --  28.7*  --   PLT  --   --  198  --   LABPROT 22.0*  --  25.5* 30.2*  INR 1.99*  --  2.42* 3.02*  CREATININE  --  0.80 0.84  --     Estimated Creatinine Clearance: 102.4 ml/min (by C-G formula based on Cr of 0.84).  Assessment: [age] YOM s/p B/L TKA on 4/8, on coumadin for VTE prophylaxis. INR 2.42 > 3.02 this morning. CBC stable, No bleeding noted per chart.   Goal of Therapy:  INR 2-3 Monitor platelets by anticoagulation protocol: Yes   Plan:  Warfarin 2 mg PO x1 today  Daily INR while inpatient  Bayard HuggerMei Shy Guallpa, PharmD, BCPS  Clinical Pharmacist  Pager: [phone]  06/18/2013,8:50 AM

## 2013-06-18 NOTE — Progress Notes (Signed)
Inpatient Rehabilitation Center Individual Statement of Services  Patient Name:  Lowella BandyRichard Haros  Date:  06/18/2013  Welcome to the Inpatient Rehabilitation Center.  Our goal is to provide you with an individualized program based on your diagnosis and situation, designed to meet your specific needs.  With this comprehensive rehabilitation program, you will be expected to participate in at least 3 hours of rehabilitation therapies Monday-Friday, with modified therapy programming on the weekends.  Your rehabilitation program will include the following services:  Physical Therapy (PT), Occupational Therapy (OT), 24 hour per day rehabilitation nursing, Therapeutic Recreaction (TR), Case Management (Social Worker), Rehabilitation Medicine, Nutrition Services and Pharmacy Services  Weekly team conferences will be held on Tuesdays to discuss your progress.  Your Social Worker will talk with you frequently to get your input and to update you on team discussions.  Team conferences with you and your family in attendance may also be held.  Expected length of stay: 7-10 days  Overall anticipated outcome: modified independent  Depending on your progress and recovery, your program may change. Your Social Worker will coordinate services and will keep you informed of any changes. Your Social Worker's name and contact numbers are listed  below.  The following services may also be recommended but are not provided by the Inpatient Rehabilitation Center:   Driving Evaluations  Home Health Rehabiltiation Services  Outpatient Rehabilitation Services    Arrangements will be made to provide these services after discharge if needed.  Arrangements include referral to agencies that provide these services.  Your insurance has been verified to be:  Medicare and [employer] Your primary doctor is:  Dr. Salvatore DecentPaul Settle  Pertinent information will be shared with your doctor and your insurance company.  Social  Worker:  Amada JupiterLucy Bergen Magner, [place] [phone] or ([phone]   Information discussed with and copy given to patient by: Amada JupiterLucy Liseth Wann, 06/18/2013, 4:08 PM

## 2013-06-18 NOTE — Progress Notes (Signed)
Placed patient on CPAP at this time with no problems. Patient to notify the nurse to call respiratory if any problems arise.

## 2013-06-18 NOTE — Progress Notes (Signed)
Physical Therapy Session Note  Patient Details  Name: Lowella BandyRichard Hayner MRN: [id number] Date of Birth: 04/25/1945  Today's Date: 06/18/2013 Time: Treatment Session 1: 0930-1030; Treatment Session 2: [id number] Time Calculation (min): Treatment Session 1: 60 min; Treatment Session 2: 45min  Short Term Goals: Week 1:  PT Short Term Goal 1 (Week 1): STGs=LTGs due to anticipated LOS  Skilled Therapeutic Interventions/Progress Updates:  Treatment Session 1:  1:1. Pt received sitting in w/c, ready for therapy. Pt propelled w/c 175'x2 w/  B UE to target functional endurance/strengthening. Unable to assess ambulation and stair negotiation as KIs have not yet been delivered (should be sometime today). Pt unable to attempt stand to perform SPT w/c>tx due to B LE fatigue, use of scoot transfers instead w/ min A. Mod A for t/f sit<>sup. Pt performed B LE exercises to address strength/ROM. Seated exercise included: knee flexion (5sec hold, 3x4 reps), LAQ (assisted, 2x10), hamstring stretch w/ feet on 6" step (1 at a time 2x30sec hold). Supine exercises included 1-2x10 reps: gravity assisted knee ext w/ pillows under distal B LE, ankle pumps, glute sets, SAQ (assisted), quad sets (very poor quad contraction noted), heel slides (assisted), SLR (5x each leg, assisted). Pt w/ fair tolerance to exercises overall. Pt w/ complaints of hallucinations at end of session, RN aware. Ax2 persons for squat pivot t/f tx mat>w/c as well as SBT w/c>bed. RN and nurse techs aware that pt is Ax2 persons via SBT for safe transfers bed<>w/c until safe otherwise. Pt w/ fair tolerance to therex due to pain.   Treatment Session 2:  1:1. Pt received supine in bed, ready for therapy. Family present, education regarding rehab environment, role of therapies and general safety plan. Pt's family verbalized concerns regarding pt's lethargy/"loopiness", answered questions as appropriate.  Medicine related questions directed to nursing. Focus  remainder of session on functional transfers and toileting. Pt req (S) for t/f sup>sit EOB w/ use of hospital bed functions and min A for amb w/ RW (KIs donned) bed>toilet. Pt performed multiple t/f sit<>stand from bed and toilet level w/ overall min A and min cueing for seq/safety. Min A providing for pericare. Pt sitting in w/c at end of session w/ all needs in reach, family in room. Nurse tech aware that pt now able to safely perform SPT w/ use of KIs.   Therapy Documentation Precautions:  Precautions Precautions: Knee;Fall Required Braces or Orthoses: Knee Immobilizer - Right;Knee Immobilizer - Left Knee Immobilizer - Right: Discontinue once straight leg raise with < 10 degree lag Knee Immobilizer - Left: Discontinue once straight leg raise with < 10 degree lag Restrictions Weight Bearing Restrictions: No Other Position/Activity Restrictions: WBAT  See FIM for current functional status  Therapy/Group: Individual Therapy  Denzil HughesCaroline S Seretha Estabrooks 06/18/2013, 10:32 AM

## 2013-06-18 NOTE — Progress Notes (Signed)
Occupational Therapy Session Note  Patient Details  Name: Darryl BandyRichard Chang MRN: [id number] Date of Birth: [birth year]  Today's Date: 06/18/2013 Time: 1100-1200 Time Calculation (min): 60 min  Short Term Goals: Week 1:  OT Short Term Goal 1 (Week 1): STG = LTG due to short LOS  Skilled Therapeutic Interventions/Progress Updates:    Pt engaged in bathing at shower level and dressing with sit<>stand from tub bench. Pt resting in bed with bilateral KI donned.  Pt required mod A for sit->stand from EOB with bed elevated.  Pt relies on UB considerably when ambulating with RW.  Pt utilized long handle sponge to assist with LB bathing.  Pt exhibited increased impulsivity with transitional movements and requires max verbal cues for safety awareness.  Focus on activity tolerance, safety awareness, functional amb with RW, sit<>stand, and AE use for BADLs.   Therapy Documentation Precautions:  Precautions Precautions: Knee;Fall Required Braces or Orthoses: Knee Immobilizer - Right;Knee Immobilizer - Left Knee Immobilizer - Right: Discontinue once straight leg raise with < 10 degree lag Knee Immobilizer - Left: Discontinue once straight leg raise with < 10 degree lag Restrictions Weight Bearing Restrictions: No Other Position/Activity Restrictions: WBAT Pain: Pain Assessment Pain Assessment: 0-10 Pain Score: 0-No pain  See FIM for current functional status  Therapy/Group: Individual Therapy  Darryl Chang 06/18/2013, 12:11 PM

## 2013-06-19 ENCOUNTER — Encounter (HOSPITAL_COMMUNITY): Payer: Medicare Other

## 2013-06-19 ENCOUNTER — Inpatient Hospital Stay (HOSPITAL_COMMUNITY): Payer: Medicare Other

## 2013-06-19 LAB — URINE CULTURE
COLONY COUNT: NO GROWTH
Culture: NO GROWTH

## 2013-06-19 LAB — PROTIME-INR
INR: 2.2 — AB (ref 0.00–1.49)
Prothrombin Time: 23.7 seconds — ABNORMAL HIGH (ref 11.6–15.2)

## 2013-06-19 LAB — GLUCOSE, CAPILLARY
GLUCOSE-CAPILLARY: 138 mg/dL — AB (ref 70–99)
GLUCOSE-CAPILLARY: 118 mg/dL — AB (ref 70–99)
Glucose-Capillary: 155 mg/dL — ABNORMAL HIGH (ref 70–99)
Glucose-Capillary: 143 mg/dL — ABNORMAL HIGH (ref 70–99)

## 2013-06-19 MED ORDER — WARFARIN SODIUM 3 MG PO TABS
3.0000 mg | ORAL_TABLET | Freq: Once | ORAL | Status: AC
Start: 2013-06-19 — End: 2013-06-19
  Administered 2013-06-19: 3 mg via ORAL
  Filled 2013-06-19: qty 1

## 2013-06-19 NOTE — Progress Notes (Signed)
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

## 2013-06-19 NOTE — Progress Notes (Signed)
[facility]IC[facility]ILIT[facility]     PROGRESS NOTE    Subjective/Complaints: Had a better night. Did get some sleep. Pain seems relatively controlled with ultram. cpm worked properly.  A 12 point review of systems has been performed and if not noted above is otherwise negative.   Objective: Vital Signs: Blood pressure 170/80, pulse 86, temperature 97.7 F (36.5 C), temperature source Oral, resp. rate 17, height 5\' 8"  (1.727 m), weight 112.311 kg (247 lb 9.6 oz), SpO2 96.00%. No results found.  Recent Labs  06/17/13 0700  WBC 11.0*  HGB 9.8*  HCT 28.7*  PLT 198    Recent Labs  06/16/13 2028 06/17/13 0700  NA 136* 136*  K 3.8 3.9  CL 95* 94*  GLUCOSE 159* 172*  BUN 23 23  CREATININE 0.80 0.84  CALCIUM 9.5 9.2   CBG (last 3)   Recent Labs  06/18/13 1627 06/18/13 2114 06/19/13 0655  GLUCAP 188* 145* 138*    Wt Readings from Last 3 Encounters:  06/18/13 112.311 kg (247 lb 9.6 oz)  06/11/13 115.667 kg (255 lb)  06/11/13 115.667 kg (255 lb)    Physical Exam:  Constitutional: He is oriented to person, place, and time. He appears well-developed.  HENT: oral mucosa pink and moist. Dentition good  Head: Normocephalic.  Eyes: EOM are normal.  Neck: Normal range of motion. Neck supple. No thyromegaly present.  Cardiovascular: Normal rate and regular rhythm. No murmurs, rubs, or gallops  Respiratory: Effort normal and breath sounds normal. No respiratory distress. No wheezes, rales, or rhonchi  GI: . Bowel sounds are normal. He exhibits mild distension. Non-tender Skin:  Bilateral knee incisions clean and dry and appropriately tender. Mild edema surrounding area  extremities show 1+ pedal edema.  Normal dorsalis pedis pulses  Neuro: alert and oriented--fairly focused and appropriate. CN nerves normal. sensation is decreased to light touch and proprioception in both feet  Upper extremity strength is 5/5 bilateral deltoid, bicep, tricep, grip  Lower  ext strength is 2+ in the hip flexors 1+ knee extensors, 4+/5 ankle dorsiflexors plantar flexors   Assessment/Plan: 1. Functional deficits secondary to OA of bilateral knees s/p TKA's which require 3+ hours per day of interdisciplinary therapy in a comprehensive inpatient rehab setting. Physiatrist is providing close team supervision and 24 hour management of active medical problems listed below. Physiatrist and rehab team continue to assess barriers to discharge/monitor patient progress toward functional and medical goals.  Extensive time spent by myself, my PA, and nursing reviewing care yesterday. All questions answered and changes made to patient's and family's satisfaction.   FIM: FIM - Bathing Bathing Steps Patient Completed: Chest;Right Arm;Left Arm;Abdomen;Front perineal area;Right upper leg;Left upper leg;Right lower leg (including foot);Left lower leg (including foot) Bathing: 4: Min-Patient completes 8-9 3120f 10 parts or 75+ percent  FIM - Upper Body Dressing/Undressing Upper body dressing/undressing steps patient completed: Thread/unthread right sleeve of pullover shirt/dresss;Thread/unthread left sleeve of pullover shirt/dress;Put head through opening of pull over shirt/dress;Pull shirt over trunk Upper body dressing/undressing: 5: Set-up assist to: Obtain clothing/put away FIM - Lower Body Dressing/Undressing Lower body dressing/undressing steps patient completed: Thread/unthread right pants leg;Thread/unthread left pants leg Lower body dressing/undressing: 3: Mod-Patient completed 50-74% of tasks  FIM - Toileting Toileting steps completed by patient: Adjust clothing prior to toileting;Adjust clothing after toileting;Performs perineal hygiene Toileting: 4: Steadying assist  FIM - Diplomatic Services operational officerToilet Transfers Toilet Transfers Assistive Devices: Elevated toilet seat;Grab bars;Walker Toilet Transfers: 4-From toilet/BSC: Min A (steadying Pt. > 75%);4-To toilet/BSC:  Min A (steadying Pt. >  75%)  FIM - BankerBed/Chair Transfer Bed/Chair Transfer Assistive Devices: Walker;Arm rests;Bed rails;HOB elevated Bed/Chair Transfer: 3: Supine > Sit: Mod A (lifting assist/Pt. 50-74%/lift 2 legs;4: Sit > Supine: Min A (steadying pt. > 75%/lift 1 leg);4: Bed > Chair or W/C: Min A (steadying Pt. > 75%)  FIM - Locomotion: Wheelchair Locomotion: Wheelchair: 5: Travels 150 ft or more: maneuvers on rugs and over door sills with supervision, cueing or coaxing FIM - Locomotion: Ambulation Locomotion: Ambulation Assistive Devices: Designer, industrial/productWalker - Rolling Ambulation/Gait Assistance: 4: Min assist Locomotion: Ambulation: 1: Travels less than 50 ft with minimal assistance (Pt.>75%)  Comprehension Comprehension Mode: Auditory Comprehension: 6-Follows complex conversation/direction: With extra time/assistive device  Expression Expression Mode: Verbal Expression: 6-Expresses complex ideas: With extra time/assistive device  Social Interaction Social Interaction: 6-Interacts appropriately with others with medication or extra time (anti-anxiety, antidepressant).  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 DVT Prophylaxis/Anticoagulation: Coumadin for DVT prophylaxis. Continue Lovenox until INR greater than 2.00. Monitor for any bleeding episodes. dopplers neg 3. Pain Management: Oxycodone Robaxin as needed. Monitor with increased mobility   -oxy stopped due to AMS  -ultram for moderate to severe pain  -regular ice 4. Mood/depression: Prozac 20 mg twice a day. Provide emotional support  5. Neuropsych: This patient is capable of making decisions on his own behalf.  6. Acute blood loss anemia. Followup CBC  7. Diabetes mellitus with peripheral neuropathy.  Glucophage 1000 mg twice a day, Glucotrol 10 mg twice a day. Check blood sugars a.c. and at bedtime   -reasonable control at present 8. Hyperlipidemia. Zocor  9. Hypertension. Currently on no antihypertensive medication. Patient on lisinopril 20 mg daily prior to admission. Monitor with increased mobility  10. Sleep apnea. CPAP   -added low dose scheduled ambien for sleep which helped 11.constipation. Adjusted bowel meds  12. GERD: double dose protonix, prn maalox     LOS (Days) 3 A FACE TO FACE EVALUATION WAS PERFORMED  Ranelle OysterZachary T Johanna Matto 06/19/2013 8:03 AM

## 2013-06-19 NOTE — Progress Notes (Signed)
Occupational Therapy Session Note  Patient Details  Name: Darryl BandyRichard Chang MRN: [id number] Date of Birth: 09-Jun-1945  Today's Date: 06/19/2013 Time: 1100-1156 Time Calculation (min): 56 min  Short Term Goals: Week 1:  OT Short Term Goal 1 (Week 1): STG = LTG due to short LOS  Skilled Therapeutic Interventions/Progress Updates:    Pt seated in w/c with bilateral KI in place.  Pt engaged in BADLs including bathing at shower level and dressing with sit<>stand from w/c.  Pt amb with RW to bathroom to use toilet before transferring to tub bench in shower.  Pt unable to maintain trunk extension when ambulating and relies on UB strength.  Pt returned to bed at end of session stating that he was fatigued and couldn't sit up in w/c.  Pt required max A for sit->supine (assist with lifting BLE). Focus on activity tolerance, safety awareness, functional amb with RW, sit<>stand, and standing balance.  Therapy Documentation Precautions:  Precautions Precautions: Knee;Fall Required Braces or Orthoses: Knee Immobilizer - Right;Knee Immobilizer - Left Knee Immobilizer - Right: Discontinue once straight leg raise with < 10 degree lag Knee Immobilizer - Left: Discontinue once straight leg raise with < 10 degree lag Restrictions Weight Bearing Restrictions: No Other Position/Activity Restrictions: WBAT   Pain: Pain Assessment Pain Assessment: 0-10 Pain Score: 4  Pain Type: Surgical pain Pain Location: Knee Pain Orientation: Right;Left Pain Descriptors / Indicators: Aching Pain Onset: On-going Pain Intervention(s): RN made aware;Repositioned  See FIM for current functional status  Therapy/Group: Individual Therapy  Rich Bravehomas Chappell Emory Leaver 06/19/2013, 12:15 PM

## 2013-06-19 NOTE — Progress Notes (Signed)
Physical Therapy Session Note  Patient Details  Name: Darryl Chang MRN: [id number] Date of Birth: 25-May-1945  Today's Date: 06/19/2013 Time: Treatment Session 1: [id number]; Treatment Session 2: 1300-1400 Time Calculation (min): Treatment Session 1: 70 min; Treatment Session 2: 60min  Short Term Goals: Week 1:  PT Short Term Goal 1 (Week 1): STGs=LTGs due to anticipated LOS  Skilled Therapeutic Interventions/Progress Updates:  Treatment Session 1:  1:1. Pt received supine in bed, ready for therapy. Focus this session on functional transfers, ambulation, stair negotiation and B LE strength/ROM. Pt req min A for t/f sup>sit EOB w/ HOB flat and use of bed rails. Min-mod A for t/f sit<>stand dependent upon height of surface, cues for safety. Pt continues to demonstrate B (-) SLR so KIs donned, however, pt no able to intiate movement against gravity. Max A for donning/doffing KIs x2 this session. Pt able to amb 25', 15'x2 and 30' w/ RW and overall close supervision w/ cues for management of RW. Pt reports 6/10 pain during ambulation. Following demonstration by therapist, pt able to negotiate up/down 5 steps w/ use of B rail and min A. Pt initially utilized NuStep for B knee flexion/ext x5 min w/ 15-20sec holds, progressing to strength/endurance, level 1-2x5710min with target steps/min in 40-50s. Pt sitting in w/c at end of session w/ all needs in reach.   Treatment Session 2:  1:1. Pt received supine in bed, ready for therapy. Pt stating 5/10 pain, RN made aware. Focus this session on supine therex, bed mobility and ambulation. Exercises included 2x10 reps: ankle pumps/circles, quad sets (improved quality of contraction today, but difficulty sustaining), glute sets, hip abd, SAQ (assisted) and SLR (assisted). Pt req mod A for t/f sup>sit EOB w/ HOB flat and use of bed rails. Pt w/ good tolerance to amb 25'x2 w/ RW and supervision overall, demonstrating slow but consistent reciprocal pattern. Pt sitting  in w/c at end of session w/ all needs in reach.   Therapy Documentation Precautions:  Precautions Precautions: Knee;Fall Required Braces or Orthoses: Knee Immobilizer - Right;Knee Immobilizer - Left Knee Immobilizer - Right: Discontinue once straight leg raise with < 10 degree lag Knee Immobilizer - Left: Discontinue once straight leg raise with < 10 degree lag Restrictions Weight Bearing Restrictions: No Other Position/Activity Restrictions: WBAT  See FIM for current functional status  Therapy/Group: Individual Therapy  Denzil HughesCaroline S Meir Elwood 06/19/2013, 9:57 AM

## 2013-06-20 ENCOUNTER — Inpatient Hospital Stay (HOSPITAL_COMMUNITY): Payer: Medicare Other

## 2013-06-20 ENCOUNTER — Encounter (HOSPITAL_COMMUNITY): Payer: Medicare Other

## 2013-06-20 LAB — PROTIME-INR
INR: 2.09 — AB (ref 0.00–1.49)
Prothrombin Time: 22.8 seconds — ABNORMAL HIGH (ref 11.6–15.2)

## 2013-06-20 LAB — GLUCOSE, CAPILLARY
GLUCOSE-CAPILLARY: 94 mg/dL (ref 70–99)
Glucose-Capillary: 128 mg/dL — ABNORMAL HIGH (ref 70–99)
Glucose-Capillary: 126 mg/dL — ABNORMAL HIGH (ref 70–99)
Glucose-Capillary: 134 mg/dL — ABNORMAL HIGH (ref 70–99)

## 2013-06-20 MED ORDER — METHOCARBAMOL 500 MG PO TABS
500.0000 mg | ORAL_TABLET | Freq: Four times a day (QID) | ORAL | Status: DC
  Administered 2013-06-20 – 2013-06-25 (×20): 500 mg via ORAL
  Filled 2013-06-20 (×35): qty 1

## 2013-06-20 MED ORDER — WARFARIN SODIUM 3 MG PO TABS
3.0000 mg | ORAL_TABLET | Freq: Once | ORAL | Status: AC
  Administered 2013-06-20: 3 mg via ORAL
  Filled 2013-06-20: qty 1

## 2013-06-20 NOTE — Progress Notes (Signed)
Patient stated that he could place himself on CPAP when he was ready for bed and did not need any assistance from RT.  RT made patient aware that if at anytime throughout the night he needed anything for his CPAP to let his RN know and RT would be glad to come back and assist.  RT will continue to monitor.

## 2013-06-20 NOTE — Progress Notes (Addendum)
Physical Therapy Session Note  Patient Details  Name: Darryl Chang MRN: [id number] Date of Birth: March 30, 1945  Today's Date: 06/20/2013 Time: Treatment Session 1: [id number]; Treatment Session 2: 1300-1330 Time Calculation (min): Treatment Session 1: 60 min; Treatment Session 2: 30 min  Short Term Goals: Week 1:  PT Short Term Goal 1 (Week 1): STGs=LTGs due to anticipated LOS  Skilled Therapeutic Interventions/Progress Updates:  Treatment Session 1:  1:1. Pt received sitting in w/c, ready for therapy. Focus this session on ambulation, bed mobility, LE strength/ROM and stair negotiation. Pt able to amb 75', 50'x2 w/ RW and supervision, demonstrating antalgic step through pattern w/ overall good tolerance. Pt practiced bed mobility in a standard bed w/ use of RW for UE assist to push up. Pt able to demonstrate (+) SLR on L LE, KI removed. Pt very close to achieving (+) SLR on R LE, continued use of R KI. Pt initially utilized NuStep for B knee flexion/ext x5 min w/ 15-20sec holds, progressing to strength/endurance, level 1-2x6910min with target steps/min in 40-50s. Pt practiced negotiation up/down curb step x2 w/ use of RW and min A. Pt propelled w/c x150' back to room w/ (S) to target B UE strength/endurance. Pt sitting in w/c at end of session w/ all needs in reach.   Treatment Session 2:  1:1. Pt received semi-reclined in bed, ready for therapy. Focus this session on mobility in room and toileting. Pt req min A for t/f sup>sit w/ HOB flat and use bed rail. Pt req overall close (S) to min A for t/f sit<>stand from bed/toilet, amb w/ RW from bed>toilet>sink>w/c as well as pericare and management of pants up/down during toileting. Pt req min A for changing pants. Pt demonstrated ability to stand at sink while washing hands w/out B UE support and supervision. Educated pt, wife and daughter regarding anticipated d/c date, goals for d/c and need for follow up physical therapy. Pt sitting in w/c at end of  session w/ all needs in reach, wife and daughter in room.   Therapy Documentation Precautions:  Precautions Precautions: Knee;Fall Required Braces or Orthoses: Knee Immobilizer - Right;Knee Immobilizer - Left Knee Immobilizer - Right: Discontinue once straight leg raise with < 10 degree lag Knee Immobilizer - Left: Discontinue once straight leg raise with < 10 degree lag Restrictions Weight Bearing Restrictions: No Other Position/Activity Restrictions: WBAT Pain: Pain Assessment Pain Assessment: 0-10 Pain Score: 6  (during ambulation) Pain Type: Surgical pain Pain Location: Knee Pain Orientation: Left;Right Pain Descriptors / Indicators: Aching Pain Onset: On-going Pain Intervention(s): Rest  See FIM for current functional status  Therapy/Group: Individual Therapy  Denzil HughesCaroline S Adrie Picking 06/20/2013, 10:17 AM

## 2013-06-20 NOTE — Plan of Care (Signed)
Problem: RH Ambulation Goal: LTG Patient will ambulate in controlled environment (PT) LTG: Patient will ambulate in a controlled environment, # of feet with assistance (PT).  Upgraded due to improved tolerance, strength and endurance.

## 2013-06-20 NOTE — Progress Notes (Signed)
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

## 2013-06-20 NOTE — Progress Notes (Signed)
Occupational Therapy Session Note  Patient Details  Name: Darryl BandyRichard Chang MRN: [id number] Date of Birth: 01/14/1946  Today's Date: 06/20/2013  Session 1 Time: 0800-0900 Time Calculation (min): 60 min  Short Term Goals: Week 1:  OT Short Term Goal 1 (Week 1): STG = LTG due to short LOS  Skilled Therapeutic Interventions/Progress Updates:    Pt resting in bed upon arrival and initially stated he didn't want to take a shower.  Pt amb with RW (after bilateral KI donned) with min A to bathroom to use toilet.  Pt decided to take shower and amb with RW to tub bench to complete bathing using long handle sponge appropriately to bathe BLE. Introduced sock aid and patient used one time and stated that his wife was going to put his socks on for hime when he gets home. Focus on activity tolerance, safety awareness, sit<>stand, transfers, functional amb with RW, and dynamic standing balance.   Therapy Documentation Precautions:  Precautions Precautions: Knee;Fall Required Braces or Orthoses: Knee Immobilizer - Right;Knee Immobilizer - Left Knee Immobilizer - Right: Discontinue once straight leg raise with < 10 degree lag Knee Immobilizer - Left: Discontinue once straight leg raise with < 10 degree lag Restrictions Weight Bearing Restrictions: No Other Position/Activity Restrictions: WBAT Pain: Pain Assessment Pain Score: 7  Pain Type: Surgical pain Pain Location: Knee Pain Orientation: Right;Left Pain Descriptors / Indicators: Aching Pain Onset: On-going Pain Intervention(s): RN made aware;Repositioned  See FIM for current functional status  Therapy/Group: Individual Therapy  Session 2 Time: 1335-1415 Pt c/o 6/10 pain in RLE; RN aware and repositioned Individual Therapy  Pt's wife and daughter present to observe therapy.  Pt engaged in functional amb with RW for home mgmt tasks and static standing tasks without BUE support.  Pt stood at hi-lo table X 5 (2 min each) while playing game  of solitaire.  Pt able to stand erect with verbal cues and maintain standing without BUE support.  Pt returned to room and walk with RW from door to bed.  Pt requires assistance to lift BLE into bed but is able to position self in bed without assistance.  Pt requested pain meds and the CPM machine be hooked up at end of session.  RN notified. Focus on activity tolerance, static standing balance, sit<>stand, functional amb with RW, and safety awareness.  Rich Bravehomas Chappell Deshay Kirstein 06/20/2013, 9:09 AM

## 2013-06-20 NOTE — Progress Notes (Signed)
Placed patient on CPAP around 2100. Patient has his own mask and tubing with our machine and is tolerating well at this time.

## 2013-06-20 NOTE — Progress Notes (Signed)
[place] PHYSICAL MEDICINE & REHABILITATION     PROGRESS NOTE    Subjective/Complaints: Much better night. Having spasms in left leg. Feels clearer today A 12 point review of systems has been performed and if not noted above is otherwise negative.   Objective: Vital Signs: Blood pressure 176/80, pulse 75, temperature 97.6 F (36.4 C), temperature source Oral, resp. rate 19, height 5\' 8"  (1.727 m), weight 112.311 kg (247 lb 9.6 oz), SpO2 100.00%. No results found. No results found for this basename: WBC, HGB, HCT, PLT,  in the last 72 hours No results found for this basename: NA, K, CL, CO, GLUCOSE, BUN, CREATININE, CALCIUM,  in the last 72 hours CBG (last 3)   Recent Labs  06/19/13 1148 06/19/13 1611 06/19/13 2120  GLUCAP 155* 118* 143*    Wt Readings from Last 3 Encounters:  06/18/13 112.311 kg (247 lb 9.6 oz)  06/11/13 115.667 kg (255 lb)  06/11/13 115.667 kg (255 lb)    Physical Exam:  Constitutional: He is oriented to person, place, and time. He appears well-developed.  HENT: oral mucosa pink and moist. Dentition good  Head: Normocephalic.  Eyes: EOM are normal.  Neck: Normal range of motion. Neck supple. No thyromegaly present.  Cardiovascular: Normal rate and regular rhythm. No murmurs, rubs, or gallops  Respiratory: Effort normal and breath sounds normal. No respiratory distress. No wheezes, rales, or rhonchi  GI: . Bowel sounds are normal. He exhibits mild distension. Non-tender Skin:  Bilateral knee incisions clean and dry and appropriately tender. Mild edema surrounding area left more than right extremities show 1+ pedal edema.  Normal dorsalis pedis pulses  Neuro: alert and oriented--fairly focused and appropriate. CN nerves normal. sensation is decreased to light touch and proprioception in both feet  Upper extremity strength is 5/5 bilateral deltoid, bicep, tricep, grip  Lower ext strength is 2+ in the hip flexors 1+ knee extensors, 4+/5 ankle  dorsiflexors plantar flexors   Assessment/Plan: 1. Functional deficits secondary to OA of bilateral knees s/p TKA's which require 3+ hours per day of interdisciplinary therapy in a comprehensive inpatient rehab setting. Physiatrist is providing close team supervision and 24 hour management of active medical problems listed below. Physiatrist and rehab team continue to assess barriers to discharge/monitor patient progress toward functional and medical goals.  Extensive time spent by myself, my PA, and nursing reviewing care yesterday. All questions answered and changes made to patient's and family's satisfaction.   FIM: FIM - Bathing Bathing Steps Patient Completed: Chest;Right Arm;Left Arm;Abdomen;Front perineal area;Right upper leg;Left upper leg;Right lower leg (including foot);Left lower leg (including foot) Bathing: 4: Min-Patient completes 8-9 957f 10 parts or 75+ percent  FIM - Upper Body Dressing/Undressing Upper body dressing/undressing steps patient completed: Thread/unthread right sleeve of pullover shirt/dresss;Thread/unthread left sleeve of pullover shirt/dress;Put head through opening of pull over shirt/dress;Pull shirt over trunk Upper body dressing/undressing: 5: Set-up assist to: Obtain clothing/put away FIM - Lower Body Dressing/Undressing Lower body dressing/undressing steps patient completed: Thread/unthread right pants leg;Thread/unthread left pants leg Lower body dressing/undressing: 3: Mod-Patient completed 50-74% of tasks  FIM - Toileting Toileting steps completed by patient: Performs perineal hygiene Toileting: 2: Max-Patient completed 1 of 3 steps  FIM - Diplomatic Services operational officerToilet Transfers Toilet Transfers Assistive Devices: Elevated toilet seat;Grab bars;Walker Toilet Transfers: 4-From toilet/BSC: Min A (steadying Pt. > 75%);4-To toilet/BSC: Min A (steadying Pt. > 75%)  FIM - BankerBed/Chair Transfer Bed/Chair Transfer Assistive Devices: Walker;Arm rests;Bed rails Bed/Chair Transfer:  4: Supine > Sit: Min A (steadying Pt. >  75%/lift 1 leg);3: Bed > Chair or W/C: Mod A (lift or lower assist);3: Chair or W/C > Bed: Mod A (lift or lower assist)  FIM - Locomotion: Wheelchair Locomotion: Wheelchair: 5: Travels 150 ft or more: maneuvers on rugs and over door sills with supervision, cueing or coaxing FIM - Locomotion: Ambulation Locomotion: Ambulation Assistive Devices: Designer, industrial/productWalker - Rolling Ambulation/Gait Assistance: 5: Supervision Locomotion: Ambulation: 1: Travels less than 50 ft with supervision/safety issues  Comprehension Comprehension Mode: Auditory Comprehension: 6-Follows complex conversation/direction: With extra time/assistive device  Expression Expression Mode: Verbal Expression: 6-Expresses complex ideas: With extra time/assistive device  Social Interaction Social Interaction: 6-Interacts appropriately with others with medication or extra time (anti-anxiety, antidepressant).  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 DVT Prophylaxis/Anticoagulation: Coumadin for DVT prophylaxis. Continue Lovenox until INR greater than 2.00. Monitor for any bleeding episodes. dopplers neg 3. Pain Management: will schedule robaxin    -oxy stopped due to AMS  -ultram for moderate to severe pain  -regular ice 4. Mood/depression: Prozac 20 mg twice a day. Provide emotional support  5. Neuropsych: This patient is capable of making decisions on his own behalf.  6. Acute blood loss anemia. Followup CBC  7. Diabetes mellitus with peripheral neuropathy. Glucophage 1000 mg twice a day, Glucotrol 10 mg twice a day. Check blood sugars a.c. and at bedtime   -reasonable control at present 8. Hyperlipidemia. Zocor  9. Hypertension.  Currently on no antihypertensive medication. Patient on lisinopril 20 mg daily prior to admission. Monitor with increased mobility  10. Sleep apnea. CPAP   -added low dose scheduled ambien for sleep which helped 11.constipation. Adjusted bowel meds  12. GERD: double dose protonix, prn maalox     LOS (Days) 4 A FACE TO FACE EVALUATION WAS PERFORMED  Ranelle OysterZachary T Zarin Knupp 06/20/2013 8:25 AM

## 2013-06-21 LAB — GLUCOSE, CAPILLARY
Glucose-Capillary: 112 mg/dL — ABNORMAL HIGH (ref 70–99)
Glucose-Capillary: 96 mg/dL (ref 70–99)
Glucose-Capillary: 170 mg/dL — ABNORMAL HIGH (ref 70–99)

## 2013-06-21 LAB — PROTIME-INR
INR: 2.26 — AB (ref 0.00–1.49)
Prothrombin Time: 24.2 seconds — ABNORMAL HIGH (ref 11.6–15.2)

## 2013-06-21 MED ORDER — WARFARIN SODIUM 3 MG PO TABS
3.0000 mg | ORAL_TABLET | Freq: Every day | ORAL | Status: DC
  Administered 2013-06-21 – 2013-06-23 (×3): 3 mg via ORAL
  Filled 2013-06-21 (×4): qty 1

## 2013-06-21 NOTE — Progress Notes (Signed)
ANTICOAGULATION CONSULT NOTE - Follow-up Consult  Pharmacy Consult for Coumadin Indication: VTE prophylaxis s/p b/l TKA  Allergies  Allergen Reactions  . Other     NESTLE CHOCOLATE DRINK - ? COCOA    Patient Measurements: Height: 5\' 8"  (172.7 cm) Weight:  (RN notified) IBW/kg (Calculated) : 68.4  Vital Signs: Temp: 98.6 F (37 C) (04/18 0542) Temp src: Oral (04/18 0542) BP: 151/80 mmHg (04/18 0542) Pulse Rate: 84 (04/18 0542)  Labs:  Recent Labs  06/19/13 0430 06/20/13 0900 06/21/13 0549  LABPROT 23.7* 22.8* 24.2*  INR 2.20* 2.09* 2.26*    Estimated Creatinine Clearance: 101 ml/min (by C-G formula based on Cr of 0.84).  Assessment: 68 y.o. M on coumadin for VTE prophylaxis s/p b/l BKA. INR therapeutic. No bleeding noted. Appears pt needs ~3mg  daily.  Goal of Therapy:  INR 2-3 Monitor platelets by anticoagulation protocol: Yes   Plan:  1) Coumadin 3mg  daily 2) Daily INR for now - consider change to M/W/F if remains stable on scheduled does 3) Consider resume home lisinopril for HTN  Christoper Fabianaron Marigold Mom, PharmD, BCPS Clinical pharmacist, pager [phone] 06/21/2013,8:20 AM

## 2013-06-21 NOTE — Progress Notes (Signed)
Physical Therapy Note  Patient Details  Name: Darryl BandyRichard Chang MRN: [id number] Date of Birth: February 27, 1946 Today's Date: 06/21/2013 1140-1215, 35 min individual therapy Pain bil knees rated 6/10 at start of session; 0/10 at end of session, cryotherapy bil knees at end of tx  Pt performed straight leg raise RLE without extensor lag, x 3  in bed; R KI no longer indicated. Bed mobility in flat bed without rails, supervision.   Pt stated he needed to use toilet.  Sit > stand from raised bed to RW.  Gait in room with RW, x 15' x 2 with min guard assist.  Pt stood at toilet to urinate and stood to wash hands at sink, min guard assist.   In sitting in w/c, pt performed therapeutic ex : R mid arc knee ext and L long arc knee ext; R and L terminal knee ext, x 10 each, all with counting aloud to avoid Valsalva maneuver.    Instructed pt in most therapeutic filling and positioning of ice packs for cryotherapy for bil knees at end of session.  Discussed life- time fitness regarding  benefits for diabetes and weight control.    Susa LofflerCaroline O Rishaan Gunner 06/21/2013, 12:47 PM

## 2013-06-21 NOTE — Progress Notes (Signed)
Occupational Therapy Note Patient Details  Name: Darryl Chang MRN: [id number] Date of Birth: 1945-11-12 Today's Date: 06/21/2013  Time:  1200-1230  (30 min) Pain: 8/10 bilateral knee pain  Ice packs applied and nursing aware.   Individual session  Pt seen for wc mobility, UE strength and AROM for functional mobility and strength  And endurance.   Pt. Propelled wc to gym.  Did SCIFIT for 5 mins counterclockwise  And then clockwise 5 mins at 4 wkload and 40 RPm.     Humberto Sealslizabeth J Jerrianne Hartin 06/21/2013, 7:19 PM

## 2013-06-21 NOTE — Progress Notes (Signed)
[place] PHYSICAL MEDICINE & REHABILITATION     PROGRESS NOTE    Subjective/Complaints: Didn't sleep quite as well. Knees sore. Worked very hard yesterday. Not getting ice as i requested A 12 point review of systems has been performed and if not noted above is otherwise negative.   Objective: Vital Signs: Blood pressure 151/80, pulse 84, temperature 98.6 F (37 C), temperature source Oral, resp. rate 20, height 5\' 8"  (1.727 m), weight 112.311 kg (247 lb 9.6 oz), SpO2 99.00%. No results found. No results found for this basename: WBC, HGB, HCT, PLT,  in the last 72 hours No results found for this basename: NA, K, CL, CO, GLUCOSE, BUN, CREATININE, CALCIUM,  in the last 72 hours CBG (last 3)   Recent Labs  06/20/13 1134 06/20/13 1627 06/20/13 2127  GLUCAP 126* 134* 94    Wt Readings from Last 3 Encounters:  06/18/13 112.311 kg (247 lb 9.6 oz)  06/11/13 115.667 kg (255 lb)  06/11/13 115.667 kg (255 lb)    Physical Exam:  Constitutional: He is oriented to person, place, and time. He appears well-developed.  HENT: oral mucosa pink and moist. Dentition good  Head: Normocephalic.  Eyes: EOM are normal.  Neck: Normal range of motion. Neck supple. No thyromegaly present.  Cardiovascular: Normal rate and regular rhythm. No murmurs, rubs, or gallops  Respiratory: Effort normal and breath sounds normal. No respiratory distress. No wheezes, rales, or rhonchi  GI: . Bowel sounds are normal. He exhibits mild distension. Non-tender Skin:  Bilateral knee incisions clean and dry and appropriately tender. Mild edema surrounding area left more than right extremities show 1+ pedal edema. Knee ROM near 80deg Pulses 2+ Neuro: alert and oriented--cognition normal CN nerves normal. sensation is decreased to light touch and proprioception in both feet  Upper extremity strength is 5/5 bilateral deltoid, bicep, tricep, grip  Lower ext strength is 2+ in the hip flexors 1+ knee extensors, 4+/5  ankle dorsiflexors plantar flexors   Assessment/Plan: 1. Functional deficits secondary to OA of bilateral knees s/p TKA's which require 3+ hours per day of interdisciplinary therapy in a comprehensive inpatient rehab setting. Physiatrist is providing close team supervision and 24 hour management of active medical problems listed below. Physiatrist and rehab team continue to assess barriers to discharge/monitor patient progress toward functional and medical goals.  Extensive time spent by myself, my PA, and nursing reviewing care yesterday. All questions answered and changes made to patient's and family's satisfaction.   FIM: FIM - Bathing Bathing Steps Patient Completed: Chest;Right Arm;Left Arm;Abdomen;Front perineal area;Right upper leg;Left upper leg;Right lower leg (including foot);Left lower leg (including foot) Bathing: 4: Steadying assist  FIM - Upper Body Dressing/Undressing Upper body dressing/undressing steps patient completed: Thread/unthread right sleeve of pullover shirt/dresss;Thread/unthread left sleeve of pullover shirt/dress;Put head through opening of pull over shirt/dress;Pull shirt over trunk Upper body dressing/undressing: 5: Set-up assist to: Obtain clothing/put away FIM - Lower Body Dressing/Undressing Lower body dressing/undressing steps patient completed: Thread/unthread right pants leg;Thread/unthread left pants leg Lower body dressing/undressing: 3: Mod-Patient completed 50-74% of tasks  FIM - Toileting Toileting steps completed by patient: Adjust clothing prior to toileting;Performs perineal hygiene Toileting: 3: Mod-Patient completed 2 of 3 steps  FIM - Diplomatic Services operational officerToilet Transfers Toilet Transfers Assistive Devices: Elevated toilet seat;Grab bars;Walker Toilet Transfers: 4-From toilet/BSC: Min A (steadying Pt. > 75%);4-To toilet/BSC: Min A (steadying Pt. > 75%)  FIM - BankerBed/Chair Transfer Bed/Chair Transfer Assistive Devices: Walker;Arm rests;Bed rails Bed/Chair  Transfer: 4: Sit > Supine: Min A (steadying  pt. > 75%/lift 1 leg);4: Bed > Chair or W/C: Min A (steadying Pt. > 75%);4: Chair or W/C > Bed: Min A (steadying Pt. > 75%);4: Supine > Sit: Min A (steadying Pt. > 75%/lift 1 leg)  FIM - Locomotion: Wheelchair Locomotion: Wheelchair: 5: Travels 150 ft or more: maneuvers on rugs and over door sills with supervision, cueing or coaxing FIM - Locomotion: Ambulation Locomotion: Ambulation Assistive Devices: Designer, industrial/productWalker - Rolling Ambulation/Gait Assistance: 5: Supervision Locomotion: Ambulation: 2: Travels 50 - 149 ft with supervision/safety issues  Comprehension Comprehension Mode: Auditory Comprehension: 6-Follows complex conversation/direction: With extra time/assistive device  Expression Expression Mode: Verbal Expression: 6-Expresses complex ideas: With extra time/assistive device  Social Interaction Social Interaction: 6-Interacts appropriately with others with medication or extra time (anti-anxiety, antidepressant).  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 DVT Prophylaxis/Anticoagulation: Coumadin for DVT prophylaxis. Continue Lovenox until INR greater than 2.00. Monitor for any bleeding episodes. dopplers neg 3. Pain Management: will schedule robaxin    -oxy stopped due to AMS  -ultram for moderate to severe pain  -regular ice (not being provided) 4. Mood/depression: Prozac 20 mg twice a day. Provide emotional support  5. Neuropsych: This patient is capable of making decisions on his own behalf.  6. Acute blood loss anemia. Followup CBC  7. Diabetes mellitus with peripheral neuropathy. Glucophage 1000 mg twice a day, Glucotrol 10 mg twice a day. Check blood sugars a.c. and at bedtime    -reasonable control at present 8. Hyperlipidemia. Zocor  9. Hypertension. Currently on no antihypertensive medication. Patient on lisinopril 20 mg daily prior to admission. Monitor with increased mobility  10. Sleep apnea. CPAP   -added low dose scheduled ambien for sleep which helped 11.constipation. Adjusted bowel meds  12. GERD: double dose protonix, prn maalox---improved     LOS (Days) 5 A FACE TO FACE EVALUATION WAS PERFORMED  [name] 06/21/2013 7:30 AM

## 2013-06-21 NOTE — Progress Notes (Signed)
Physical Therapy Session Note  Patient Details  Name: Lowella BandyRichard Giammarino MRN: [id number] Date of Birth: [birth year]  Today's Date: 06/21/2013 Time: [id number] Time Calculation (min): 54 min  Short Term Goals: Week 1:  PT Short Term Goal 1 (Week 1): STGs=LTGs due to anticipated LOS  Skilled Therapeutic Interventions/Progress Updates:  Pt was seen in rehab gym in the pm. Performed LE exercises focusing on B knee flexion and extension. Pt ambulated with rolling walker and S for 25 feet and 50 feet. Pt propelled w/c about 150 feet with B UEs and S. Pt transferred multiple times with rolling walker and S to min guard. Pt transferred edge of bed to supine with min A.   Therapy Documentation Precautions:  Precautions Precautions: Knee;Fall Required Braces or Orthoses: Knee Immobilizer - Right;Knee Immobilizer - Left Knee Immobilizer - Right: Discontinue once straight leg raise with < 10 degree lag Knee Immobilizer - Left: Discontinue once straight leg raise with < 10 degree lag Restrictions Weight Bearing Restrictions: No Other Position/Activity Restrictions: WBAT General:   Pain: Pt c/o 4/10 pain B knees.   Locomotion : Ambulation Ambulation/Gait Assistance: 5: Supervision   See FIM for current functional status  Therapy/Group: Individual Therapy  Rayford HalstedJames G Priseis Cratty 06/21/2013, 1:57 PM

## 2013-06-22 ENCOUNTER — Inpatient Hospital Stay (HOSPITAL_COMMUNITY): Payer: Medicare Other | Admitting: Occupational Therapy

## 2013-06-22 ENCOUNTER — Inpatient Hospital Stay (HOSPITAL_COMMUNITY): Payer: Medicare Other

## 2013-06-22 ENCOUNTER — Inpatient Hospital Stay (HOSPITAL_COMMUNITY): Payer: Medicare Other | Admitting: *Deleted

## 2013-06-22 LAB — GLUCOSE, CAPILLARY
GLUCOSE-CAPILLARY: 126 mg/dL — AB (ref 70–99)
GLUCOSE-CAPILLARY: 106 mg/dL — AB (ref 70–99)
Glucose-Capillary: 115 mg/dL — ABNORMAL HIGH (ref 70–99)
Glucose-Capillary: 153 mg/dL — ABNORMAL HIGH (ref 70–99)
Glucose-Capillary: 98 mg/dL (ref 70–99)

## 2013-06-22 LAB — PROTIME-INR
INR: 1.12 (ref 0.00–1.49)
INR: 2.32 — ABNORMAL HIGH (ref 0.00–1.49)
PROTHROMBIN TIME: 24.7 s — AB (ref 11.6–15.2)
Prothrombin Time: 14.2 seconds (ref 11.6–15.2)

## 2013-06-22 MED ORDER — HYDROCORTISONE ACETATE 25 MG RE SUPP
25.0000 mg | Freq: Two times a day (BID) | RECTAL | Status: DC | PRN
  Administered 2013-06-22 (×2): 25 mg via RECTAL
  Filled 2013-06-22 (×3): qty 1

## 2013-06-22 MED ORDER — DOCUSATE SODIUM 100 MG PO CAPS
100.0000 mg | ORAL_CAPSULE | Freq: Two times a day (BID) | ORAL | Status: DC
  Filled 2013-06-22 (×7): qty 1

## 2013-06-22 NOTE — Progress Notes (Signed)
[place] PHYSICAL MEDICINE & REHABILITATION     PROGRESS NOTE    Subjective/Complaints: Didn't sleep quite as well. Knees sore. Worked very hard yesterday. Not getting ice as i requested A 12 point review of systems has been performed and if not noted above is otherwise negative.   Objective: Vital Signs: Blood pressure 177/88, pulse 84, temperature 97.6 F (36.4 C), temperature source Oral, resp. rate 22, height 5\' 8"  (1.727 m), weight 112.311 kg (247 lb 9.6 oz), SpO2 98.00%. No results found. No results found for this basename: WBC, HGB, HCT, PLT,  in the last 72 hours No results found for this basename: NA, K, CL, CO, GLUCOSE, BUN, CREATININE, CALCIUM,  in the last 72 hours CBG (last 3)   Recent Labs  06/21/13 1134 06/21/13 1631 06/21/13 2059  GLUCAP 112* 96 170*    Wt Readings from Last 3 Encounters:  06/18/13 112.311 kg (247 lb 9.6 oz)  06/11/13 115.667 kg (255 lb)  06/11/13 115.667 kg (255 lb)    Physical Exam:  Constitutional: He is oriented to person, place, and time. He appears well-developed.  HENT: oral mucosa pink and moist. Dentition good  Head: Normocephalic.  Eyes: EOM are normal.  Neck: Normal range of motion. Neck supple. No thyromegaly present.  Cardiovascular: Normal rate and regular rhythm. No murmurs, rubs, or gallops  Respiratory: Effort normal and breath sounds normal. No respiratory distress. No wheezes, rales, or rhonchi  GI: . Bowel sounds are normal. He exhibits mild distension. Non-tender Skin:  Bilateral knee incisions clean and dry and appropriately tender. Mild edema surrounding area left more than right extremities show 1+ pedal edema. Knee ROM near 90deg Pulses 2+ Neuro: alert and oriented--cognition normal CN nerves normal. sensation is decreased to light touch and proprioception in both feet  Upper extremity strength is 5/5 bilateral deltoid, bicep, tricep, grip  Lower ext strength is 2+ in the hip flexors 1+ knee extensors, 4+/5  ankle dorsiflexors plantar flexors   Assessment/Plan: 1. Functional deficits secondary to OA of bilateral knees s/p TKA's which require 3+ hours per day of interdisciplinary therapy in a comprehensive inpatient rehab setting. Physiatrist is providing close team supervision and 24 hour management of active medical problems listed below. Physiatrist and rehab team continue to assess barriers to discharge/monitor patient progress toward functional and medical goals.  Progressing nicely after some initial problems. On track for dc this week  FIM: FIM - Bathing Bathing Steps Patient Completed: Chest;Right Arm;Left Arm;Abdomen;Front perineal area;Right upper leg;Left upper leg;Right lower leg (including foot);Left lower leg (including foot) Bathing: 4: Steadying assist  FIM - Upper Body Dressing/Undressing Upper body dressing/undressing steps patient completed: Thread/unthread right sleeve of pullover shirt/dresss;Thread/unthread left sleeve of pullover shirt/dress;Put head through opening of pull over shirt/dress;Pull shirt over trunk Upper body dressing/undressing: 5: Set-up assist to: Obtain clothing/put away FIM - Lower Body Dressing/Undressing Lower body dressing/undressing steps patient completed: Thread/unthread right pants leg;Thread/unthread left pants leg Lower body dressing/undressing: 3: Mod-Patient completed 50-74% of tasks  FIM - Toileting Toileting steps completed by patient: Adjust clothing prior to toileting;Performs perineal hygiene;Adjust clothing after toileting Toileting Assistive Devices: Grab bar or rail for support Toileting: 4: Steadying assist  FIM - Diplomatic Services operational officerToilet Transfers Toilet Transfers Assistive Devices: Elevated toilet seat;Grab bars;Walker Toilet Transfers: 4-From toilet/BSC: Min A (steadying Pt. > 75%);4-To toilet/BSC: Min A (steadying Pt. > 75%)  FIM - BankerBed/Chair Transfer Bed/Chair Transfer Assistive Devices: Walker;Arm rests;Bed rails Bed/Chair Transfer: 5:  Supine > Sit: Supervision (verbal cues/safety issues)  FIM -  Locomotion: Wheelchair Locomotion: Wheelchair: 0: Activity did not occur FIM - Locomotion: Ambulation Locomotion: Ambulation Assistive Devices: Designer, industrial/productWalker - Rolling Ambulation/Gait Assistance: 4: Min guard Locomotion: Ambulation: 1: Travels less than 50 ft with minimal assistance (Pt.>75%)  Comprehension Comprehension Mode: Auditory Comprehension: 6-Follows complex conversation/direction: With extra time/assistive device  Expression Expression Mode: Verbal Expression: 6-Expresses complex ideas: With extra time/assistive device  Social Interaction Social Interaction: 6-Interacts appropriately with others with medication or extra time (anti-anxiety, antidepressant).  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 DVT Prophylaxis/Anticoagulation: Coumadin for DVT prophylaxis. Continue Lovenox until INR greater than 2.00. Monitor for any bleeding episodes. dopplers neg 3. Pain Management: will schedule robaxin    -oxy stopped due to AMS  -ultram for moderate to severe pain  -regular ice  4. Mood/depression: Prozac 20 mg twice a day. Provide emotional support  5. Neuropsych: This patient is capable of making decisions on his own behalf.  6. Acute blood loss anemia. Followup CBC  7. Diabetes mellitus with peripheral neuropathy. Glucophage 1000 mg twice a day, Glucotrol 10 mg twice a day. Check blood sugars a.c. and at bedtime   -reasonable control at present 8. Hyperlipidemia. Zocor  9. Hypertension. Currently on no antihypertensive medication. Patient on lisinopril 20 mg daily prior to admission. Monitor with increased mobility  10. Sleep apnea. CPAP   -added low dose scheduled ambien for sleep which helped 11.constipation. Hold meds---4bm's yesterday   12. GERD: double dose protonix, prn maalox---improved     LOS (Days) 6 A FACE TO FACE EVALUATION WAS PERFORMED  Ranelle OysterZachary T Garl Speigner 06/22/2013 7:25 AM

## 2013-06-22 NOTE — Progress Notes (Signed)
ANTICOAGULATION CONSULT NOTE - Follow-up Consult  Pharmacy Consult for Coumadin Indication: VTE prophylaxis s/p b/l TKA  Allergies  Allergen Reactions  . Other     NESTLE CHOCOLATE DRINK - ? COCOA    Patient Measurements: Height: 5\' 8"  (172.7 cm) Weight:  (RN notified) IBW/kg (Calculated) : 68.4  Vital Signs: Temp: 97.6 F (36.4 C) (04/19 0554) Temp src: Oral (04/19 0554) BP: 177/88 mmHg (04/19 0554) Pulse Rate: 84 (04/19 0554)  Labs:  Recent Labs  06/20/13 0900 06/21/13 0549 06/22/13 0623  LABPROT 22.8* 24.2* 14.2  INR 2.09* 2.26* 1.12    Estimated Creatinine Clearance: 101 ml/min (by C-G formula based on Cr of 0.84).  Assessment: 68 y.o. M on coumadin for VTE prophylaxis s/p b/l BKA. INR therapeutic upon recheck - appears 1.12 INR at 0630 was error. No bleeding noted. Appears pt needs ~3mg  daily.  Goal of Therapy:  INR 2-3 Monitor platelets by anticoagulation protocol: Yes   Plan:  1) Coumadin 3mg  daily 2) Change INR to M/W/F 3) Consider resume home lisinopril for HTN  Christoper Fabianaron [place] Doster, PharmD, BCPS Clinical pharmacist, pager [phone] 06/22/2013,8:22 AM

## 2013-06-22 NOTE — Progress Notes (Signed)
Physical Therapy Session Note  Patient Details  Name: Darryl BandyRichard Ditommaso MRN: [id number] Date of Birth: [birth year]  Today's Date: 06/22/2013 Time: 1510-1610 Time Calculation (min): 60 min  Short Term Goals: Week 1:  PT Short Term Goal 1 (Week 1): STGs=LTGs due to anticipated LOS  Skilled Therapeutic Interventions/Progress Updates:  For bilateral knee flexion/extension NMR and ROM: Sitting edge of mat- AROM end range knee flexion with pillowcase under foot 5 sec hold x 15 each LE with L ROM > R ROM, LAQ x 5 each LE Supine on mat- quad sets for knee extension 5 sec hold x 10 each LE, with knees over bolster for comfort ankle pumps and glute sets x 30 each   Pt transferred sit > supine with supervision, supine > sit with minA, and stand pivot transfers using RW with close supervision-min guard. Pt performed car transfer with demo and supervision using RW, mod verbal cues for sequencing. Gait training x 10 ft using RW and min guard with verbal cues for safe distance between body and RW; pt declined longer distance ambulation at this time due to increase in pain with knee exercises. Pt education provided secondary to pt and wife's questions regarding outpatient PT frequency and use of RW vs rollator (pt owns rollator). Pt propelled w/c ortho gym > room and left sitting in w/c with wife present.   Therapy Documentation Precautions:  Precautions Precautions: Knee;Fall Required Braces or Orthoses: Knee Immobilizer - Right;Knee Immobilizer - Left Knee Immobilizer - Right: Discontinue once straight leg raise with < 10 degree lag Knee Immobilizer - Left: Discontinue once straight leg raise with < 10 degree lag Restrictions Weight Bearing Restrictions: No Other Position/Activity Restrictions: WBAT Pain: Pain Assessment Pain Assessment: 0-10 Pain Score: 4  Pain Type: Surgical pain Pain Location: Knee Pain Orientation: Right;Left Pain Descriptors / Indicators: Aching;Sore Pain Frequency:  Intermittent Pain Onset: With activity Patients Stated Pain Goal: 2 Pain Intervention(s): Rest, repositioned Locomotion : Ambulation Ambulation/Gait Assistance: 4: Min guard Wheelchair Mobility Distance: 150   See FIM for current functional status  Therapy/Group: Individual Therapy  Kerney ElbeRebecca A Varner 06/22/2013, 4:25 PM

## 2013-06-22 NOTE — Progress Notes (Signed)
RN placed pt on cpap at this time.  RT will continue to monitor.

## 2013-06-22 NOTE — Progress Notes (Signed)
Restless night. Multi BM's, loose with one incontinent. Scheduled colace held. May need to DC all laxatives for now. Large hemorrhoids noted. BLE edema, left > right. Ice applied 3 times in past 12 hours, patient refused on several occasions. PRN ultram given at 2120 and 0558. PRN tylenol given at 0219. Removed CPM from left leg at 2015 per patients request, at 90 degrees flexion. Martina SinnerJamie A Britne Borelli

## 2013-06-22 NOTE — Progress Notes (Signed)
Occupational Therapy Session Note  Patient Details  Name: Darryl Chang MRN: [id number] Date of Birth: [birth year]  Today's Date: 06/22/2013 Time: 1130-1200 Time Calculation (min): 30 min  Skilled Therapeutic Interventions/Progress Updates: Patient seen for overall conditioning and endurance to increase self care status and functional mobility status as follows:   Self propelled wheelchair from to therapy gym; SC[employer]it arm bike for approximately 15 and then 5 more minutes (staying around 40 RPM; level 4) before stating he was tired and could do no more.   Patient left in room with call bell, phone, lunch tray in place, and preparing to take medications from his nurse.     PAIN:  Denied during session and stated, "I have discovered the secret is to keep these knees iced."  Ice packs were donned on both knees.  Therapy Documentation Precautions: Bilateral knee replacements; Fall   See FIM for current functional status  Therapy/Group: Individual Therapy  Rozelle LoganRobyn Yeary Erla Bacchi 06/22/2013, 12:54 PM

## 2013-06-22 NOTE — Progress Notes (Signed)
Occupational Therapy Session Note  Patient Details  Name: Darryl Chang MRN: [id number] Date of Birth: [birth year]  Today's Date: 06/22/2013 Time:  - 0930-1030  (60 min)  1st session                 1430-1500  (30 min)  2nd session                    Short Term Goals: Week 1:  OT Short Term Goal 1 (Week 1): STG = LTG due to short LOS Week 2:     Skilled Therapeutic Interventions/Progress Updates:    1st session:  Addressed bathing and dressing at shower level.  Ambulated with RW to shower bench.  Pt. Bathed self with LH sponge and lateral leans for peri area.  Needed assist with footies.  Ambulated back to wc.  Propelled wc to ADL apartment.  Practiced getting in/out of bathtub to simulate getting in/out of [employer]lander SUV.  Pt. Needed mod assist lifting right left over but has good knee flexion at 90 degrees.  2nd session:  Time: 1430-1500  (30 min) Pain: 3/10 knee pain Individual session Addressed functional transfers to bed, sofa, dining chair.  Educated pt and wife on best placement and body mechanics.  Pt. Was SBA to min asssit with transfers.  He ambulated down to room with RW about 40 feet with minimal assist.  Left pt in room with wife present.        Therapy Documentation Precautions:  Precautions Precautions: Knee;Fall Required Braces or Orthoses: Knee Immobilizer - Right;Knee Immobilizer - Left Knee Immobilizer - Right: Discontinue once straight leg raise with < 10 degree lag Knee Immobilizer - Left: Discontinue once straight leg raise with < 10 degree lag Restrictions Weight Bearing Restrictions: No Other Position/Activity Restrictions: WBAT    Pain: Pain Assessment Pain Assessment: 0-10 Pain Score: 4   1st session Pain Type: Surgical pain Pain Location: Knee Pain Orientation: Right;Left Pain Descriptors / Indicators: Aching;Sore Pain Frequency: Intermittent Pain Onset: On-going Patients Stated Pain Goal: 2 Pain Intervention(s): Medication (See  eMAR) ADL: ADL ADL Comments: Refer to FIM :     See FIM for current functional status  Therapy/Group: Individual Therapy  Humberto Sealslizabeth J Vinnie Gombert 06/22/2013, 10:31 AM

## 2013-06-22 NOTE — Progress Notes (Signed)
Pt has had ice packs refreshed q2h today, and has kept them on his knees most of the time except for during therapies. He reports that keeping ice on his knees consistently has made a difference in his pain level. Has also taken prn tylenol and ultram/scheduled robaxin per the med schedule posted in his room. Declined to use CPM on this shift, stating that therapy worked his knees/legs really hard today. Encouraged pt to try to use CPM for at least a few hours before bed tonight. Pts wife expressing concern over pts diarrhea and hemorrhoids, discussed that we d/c'd laxatives and are using anusol suppositories BID. Continue with plan of care. Mick SellShannon Sammuel Blick RN

## 2013-06-23 ENCOUNTER — Inpatient Hospital Stay (HOSPITAL_COMMUNITY): Payer: Medicare Other

## 2013-06-23 ENCOUNTER — Encounter (HOSPITAL_COMMUNITY): Payer: Medicare Other

## 2013-06-23 DIAGNOSIS — M1710 Unilateral primary osteoarthritis, unspecified knee: Secondary | ICD-10-CM

## 2013-06-23 DIAGNOSIS — IMO0002 Reserved for concepts with insufficient information to code with codable children: Secondary | ICD-10-CM

## 2013-06-23 DIAGNOSIS — Z96659 Presence of unspecified artificial knee joint: Secondary | ICD-10-CM

## 2013-06-23 LAB — PROTIME-INR
INR: 2.03 — ABNORMAL HIGH (ref 0.00–1.49)
Prothrombin Time: 22.3 seconds — ABNORMAL HIGH (ref 11.6–15.2)

## 2013-06-23 LAB — GLUCOSE, CAPILLARY
GLUCOSE-CAPILLARY: 142 mg/dL — AB (ref 70–99)
GLUCOSE-CAPILLARY: 144 mg/dL — AB (ref 70–99)
Glucose-Capillary: 110 mg/dL — ABNORMAL HIGH (ref 70–99)
Glucose-Capillary: 116 mg/dL — ABNORMAL HIGH (ref 70–99)

## 2013-06-23 MED ORDER — TEMAZEPAM 15 MG PO CAPS
15.0000 mg | ORAL_CAPSULE | Freq: Every day | ORAL | Status: DC
  Administered 2013-06-23 – 2013-06-24 (×2): 15 mg via ORAL
  Filled 2013-06-23 (×2): qty 1

## 2013-06-23 MED ORDER — LOPERAMIDE HCL 2 MG PO CAPS
2.0000 mg | ORAL_CAPSULE | ORAL | Status: DC | PRN
  Administered 2013-06-23 – 2013-06-25 (×4): 2 mg via ORAL
  Filled 2013-06-23 (×4): qty 1

## 2013-06-23 MED ORDER — HYDROCORTISONE ACETATE 25 MG RE SUPP
25.0000 mg | Freq: Two times a day (BID) | RECTAL | Status: DC
  Administered 2013-06-23 – 2013-06-25 (×4): 25 mg via RECTAL
  Filled 2013-06-23 (×7): qty 1

## 2013-06-23 NOTE — Progress Notes (Signed)
At 2103, PRN ambien given, along with PRN tylenol & ultram.  Pain better managed. At 2120 PRN Anusol supp. Given. At 0301 PRN ultram given for complaint of bilateral knee pain. At 0533 PRN tylenol given. Used ice packs to knees most of night. Declined  CPM machines. Continent BM this morning. Darryl SinnerJamie A Lavora Chang

## 2013-06-23 NOTE — Progress Notes (Signed)
[facility] PHYSICAL MEDICINE & REHABILITATION     PROGRESS NOTE    Subjective/Complaints: Didn't sleep as well. ambien not working now. Hemorrhoids still bothering him. Knees feeling  Very good. A 12 point review of systems has been performed and if not noted above is otherwise negative.   Objective: Vital Signs: Blood pressure 165/80, pulse 85, temperature 97.9 F (36.6 C), temperature source Oral, resp. rate 18, height 5\' 8"  (1.727 m), weight 112.311 kg (247 lb 9.6 oz), SpO2 98.00%. No results found. No results found for this basename: WBC, HGB, HCT, PLT,  in the last 72 hours No results found for this basename: NA, K, CL, CO, GLUCOSE, BUN, CREATININE, CALCIUM,  in the last 72 hours CBG (last 3)   Recent Labs  06/22/13 1121 06/22/13 1615 06/22/13 2051  GLUCAP 106* 153* 98    Wt Readings from Last 3 Encounters:  06/18/13 112.311 kg (247 lb 9.6 oz)  06/11/13 115.667 kg (255 lb)  06/11/13 115.667 kg (255 lb)    Physical Exam:  Constitutional: He is oriented to person, place, and time. He appears well-developed.  HENT: oral mucosa pink and moist. Dentition good  Head: Normocephalic.  Eyes: EOM are normal.  Neck: Normal range of motion. Neck supple. No thyromegaly present.  Cardiovascular: Normal rate and regular rhythm. No murmurs, rubs, or gallops  Respiratory: Effort normal and breath sounds normal. No respiratory distress. No wheezes, rales, or rhonchi  GI: . Bowel sounds are normal. He exhibits mild distension. Non-tender Skin:  Bilateral knee incisions clean and dry and appropriately tender. Mild edema surrounding area left more than right extremities show 1+ pedal edema. Knee ROM  90deg Pulses 2+ Neuro: alert and oriented--cognition normal CN nerves normal. sensation is decreased to light touch and proprioception in both feet  Upper extremity strength is 5/5 bilateral deltoid, bicep, tricep, grip  Lower ext strength is 2+ in the hip flexors 1+ knee extensors, 4+/5  ankle dorsiflexors plantar flexors   Assessment/Plan: 1. Functional deficits secondary to OA of bilateral knees s/p TKA's which require 3+ hours per day of interdisciplinary therapy in a comprehensive inpatient rehab setting. Physiatrist is providing close team supervision and 24 hour management of active medical problems listed below. Physiatrist and rehab team continue to assess barriers to discharge/monitor patient progress toward functional and medical goals.  WANTS TO DO OUTPT PT NEAR HOME IN [place]---THRU A FRIEND   FIM: FIM - Bathing Bathing Steps Patient Completed: Chest;Right Arm;Left Arm;Abdomen;Front perineal area;Right upper leg;Left upper leg;Right lower leg (including foot);Left lower leg (including foot);Buttocks Bathing: 5: Supervision: Safety issues/verbal cues  FIM - Upper Body Dressing/Undressing Upper body dressing/undressing steps patient completed: Thread/unthread right sleeve of pullover shirt/dresss;Thread/unthread left sleeve of pullover shirt/dress;Put head through opening of pull over shirt/dress;Pull shirt over trunk Upper body dressing/undressing: 5: Set-up assist to: Obtain clothing/put away FIM - Lower Body Dressing/Undressing Lower body dressing/undressing steps patient completed: Thread/unthread right pants leg;Thread/unthread left pants leg;Pull pants up/down Lower body dressing/undressing: 4: Min-Patient completed 75 plus % of tasks  FIM - Toileting Toileting steps completed by patient: Adjust clothing prior to toileting;Performs perineal hygiene;Adjust clothing after toileting Toileting Assistive Devices: Grab bar or rail for support Toileting: 4: Steadying assist  FIM - Diplomatic Services operational officerToilet Transfers Toilet Transfers Assistive Devices: Elevated toilet seat;Grab bars;Walker Toilet Transfers: 4-From toilet/BSC: Min A (steadying Pt. > 75%);4-To toilet/BSC: Min A (steadying Pt. > 75%)  FIM - Bed/Chair Transfer Bed/Chair Transfer Assistive Devices:  Therapist, occupationalWalker Bed/Chair Transfer: 5: Supine > Sit: Supervision (verbal cues/safety  issues);5: Sit > Supine: Supervision (verbal cues/safety issues);5: Bed > Chair or W/C: Supervision (verbal cues/safety issues);5: Chair or W/C > Bed: Supervision (verbal cues/safety issues)  FIM - Locomotion: Wheelchair Distance: 150 Locomotion: Wheelchair: 5: Travels 150 ft or more: maneuvers on rugs and over door sills with supervision, cueing or coaxing FIM - Locomotion: Ambulation Locomotion: Ambulation Assistive Devices: Designer, industrial/productWalker - Rolling Ambulation/Gait Assistance: 4: Min guard Locomotion: Ambulation: 1: Travels less than 50 ft with minimal assistance (Pt.>75%)  Comprehension Comprehension Mode: Auditory Comprehension: 6-Follows complex conversation/direction: With extra time/assistive device  Expression Expression Mode: Verbal Expression: 6-Expresses complex ideas: With extra time/assistive device  Social Interaction Social Interaction: 6-Interacts appropriately with others with medication or extra time (anti-anxiety, antidepressant).  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 DVT Prophylaxis/Anticoagulation: Coumadin for DVT prophylaxis. Continue Lovenox until INR greater than 2.00. Monitor for any bleeding episodes. dopplers neg 3. Pain Management: will schedule robaxin    -oxy stopped due to AMS  -ultram for moderate to severe pain  -regular ice  4. Mood/depression: Prozac 20 mg twice a day. Provide emotional support  5. Neuropsych: This patient is capable of making decisions on his own behalf.  6. Acute blood loss anemia. Followup CBC  7. Diabetes mellitus with peripheral neuropathy. Glucophage 1000 mg twice a day, Glucotrol 10 mg twice a day. Check blood sugars a.c. and at bedtime   -reasonable control at present 8.  Hyperlipidemia. Zocor  9. Hypertension. Currently on no antihypertensive medication. Patient on lisinopril 20 mg daily prior to admission. Monitor with increased mobility  10. Sleep apnea. CPAP   -ambien not working----will try restoril 11.constipation. Resume bid colace  -supp's for hemorrhoids 12. GERD: double dose protonix, prn maalox---improved     LOS (Days) 7 A FACE TO FACE EVALUATION WAS PERFORMED  Ranelle OysterZachary T Ashrita Chrismer 06/23/2013 7:48 AM

## 2013-06-23 NOTE — Progress Notes (Signed)
Occupational Therapy Session Note  Patient Details  Name: Lowella BandyRichard Pell MRN: [id number] Date of Birth: 1945-08-19  Today's Date: 06/23/2013  [place] 1 Time: 0700-0755 Time Calculation (min): 55 min  Short Term Goals: Week 1:  OT Short Term Goal 1 (Week 1): STG = LTG due to short LOS  Skilled Therapeutic Interventions/Progress Updates:    Pt engaged in bathing at shower level and dressing with sit<>stand from shower seat.  Pt required assistance with donning socks only.  Pt amb with RW from bed to bathroom to complete toileting tasks prior to bathing.  Pt returned to bed after bathing and dressing and ice applied to bilateral knees.  Focus on activity tolerance, bed mobility, dynamic and static standing balance, functional amb with RW, and safety awareness.  Therapy Documentation Precautions:  Precautions Precautions: Knee;Fall Required Braces or Orthoses: Knee Immobilizer - Right;Knee Immobilizer - Left Knee Immobilizer - Right: Discontinue once straight leg raise with < 10 degree lag Knee Immobilizer - Left: Discontinue once straight leg raise with < 10 degree lag Restrictions Weight Bearing Restrictions: No Other Position/Activity Restrictions: WBAT Pain: Pain Assessment Pain Assessment: 0-10 Pain Score: 4  Pain Type: Surgical pain Pain Location: Knee Pain Orientation: Right;Left Pain Descriptors / Indicators: Aching;Sore Pain Frequency: Intermittent Pain Onset: On-going Pain Intervention(s): RN made aware;Repositioned;Cold applied Multiple Pain Sites: No ADL: ADL ADL Comments: Refer to FIM  See FIM for current functional status  Therapy/Group: Individual Therapy  Session 2 Time: 1100-1130 Pt denies pain Individual Therapy  Pt amb with RW to ADL apartment to practice bed mobility on standard bed and RW safety in home setting.  Pt transitioned to simple kitchen safety tasks while amb with RW.  Pt exhibited safe behaviors in all settings.Focus on activity  tolerance, safety awareness, dynamic standing balance, and functional amb with RW.   Liberty Handyhomas Chappell Aaran Enberg 06/23/2013, 8:00 AM

## 2013-06-23 NOTE — Progress Notes (Signed)
Coumadin per pharmacy  Anticoagulation: coumadin for VTE px s/p b/L TKA, INR therapeutic at 2.03.  INR M/W/F.    INR goal 2-3  Plan:  Warfarin 3 mg PO daily Extra INR tom, but otherwise INR MWF

## 2013-06-23 NOTE — Progress Notes (Signed)
Physical Therapy Session Note  Patient Details  Name: Darryl Chang MRN: [id number] Date of Birth: 1945/10/28  Today's Date: 06/23/2013 Time: Treatment Session 1: [id number]; Treatment Session 2: [id number] Time Calculation (min): Treatment Session 1: 55 min; Treatment Session 2: 45min  Short Term Goals: Week 1:  PT Short Term Goal 1 (Week 1): STGs=LTGs due to anticipated LOS  Skilled Therapeutic Interventions/Progress Updates:  Treatment Session 1:  1:1. Pt received supine in bed, ready for therapy. Supervision for t/f sup<>sit and all sit<>stand transfers this session, min cueing for hand placement for improved safety. Focus this session on ambulation, stair negotiation, car transfer and B LE strength/ROM. Pt w/ overall good tolerance to amb 100'x2 and 150' w/ RW and (S), min cueing for increased B hip/knee flexion to encourage more normalized gait pattern. Pt practiced negotiation up/down 4 steps w/ single rail, sideways as well as up/down 6" curb step x2 w/ RW, req overall supervision w/ cueing for technique. Pt performed car t/f w/ supervision, initially demonstrating mild difficulty self-assisting B LE into car but demonstrated good problem. Use of NuStep for B LE strength/knee ROM, level 3x3110min. Pt supine in bed at end of session w/ all needs in reach, CPM in place on L LE.   Treatment Session 2:  1:1. Pt received sitting in w/c, ready for therapy. Focus this session on ambulation and B LE strength/ROM. Pt w/ good tolerance to amb 175'x2 w/ RW and (S), cues for increased hip/knee flexion. Pt demonstrates progressive trunk flexion w/ fatigue. Pt performed seated therex, exercises included 2x10 reps: heel/toe raises, LAQ, quad sets w/ B LE elevated on 6" block, active knee flexion, gravity assisted knee ext (2min). Pt req (S) for toileting at end of session as well as t/f sit>sup. Pt supine in bed at end of session w/ all needs in reach, CPM in place on R LE.   Therapy  Documentation Precautions:  Precautions Precautions: Knee;Fall Required Braces or Orthoses: Knee Immobilizer - Right;Knee Immobilizer - Left Knee Immobilizer - Right: Discontinue once straight leg raise with < 10 degree lag Knee Immobilizer - Left: Discontinue once straight leg raise with < 10 degree lag Restrictions Weight Bearing Restrictions: No Other Position/Activity Restrictions: WBAT Pain: Pain Assessment Pain Assessment: No/denies pain Pain Score: 0-No pain Pain Type: Surgical pain Pain Location: Knee Pain Orientation: Right;Left Pain Descriptors / Indicators: Aching;Sore Pain Frequency: Intermittent Pain Onset: On-going Pain Intervention(s): RN made aware;Repositioned;Cold applied Multiple Pain Sites: No  See FIM for current functional status  Therapy/Group: Individual Therapy  Denzil HughesCaroline S Anissa Abbs 06/23/2013, 9:29 AM

## 2013-06-24 ENCOUNTER — Inpatient Hospital Stay (HOSPITAL_COMMUNITY): Payer: Medicare Other

## 2013-06-24 ENCOUNTER — Encounter (HOSPITAL_COMMUNITY): Payer: Medicare Other

## 2013-06-24 DIAGNOSIS — IMO0002 Reserved for concepts with insufficient information to code with codable children: Secondary | ICD-10-CM

## 2013-06-24 DIAGNOSIS — Z96659 Presence of unspecified artificial knee joint: Secondary | ICD-10-CM

## 2013-06-24 DIAGNOSIS — M1710 Unilateral primary osteoarthritis, unspecified knee: Secondary | ICD-10-CM

## 2013-06-24 LAB — GLUCOSE, CAPILLARY
Glucose-Capillary: 121 mg/dL — ABNORMAL HIGH (ref 70–99)
Glucose-Capillary: 89 mg/dL (ref 70–99)
Glucose-Capillary: 114 mg/dL — ABNORMAL HIGH (ref 70–99)
Glucose-Capillary: 98 mg/dL (ref 70–99)

## 2013-06-24 LAB — PROTIME-INR
INR: 1.84 — AB (ref 0.00–1.49)
PROTHROMBIN TIME: 20.7 s — AB (ref 11.6–15.2)

## 2013-06-24 MED ORDER — WARFARIN SODIUM 5 MG PO TABS
5.0000 mg | ORAL_TABLET | Freq: Once | ORAL | Status: AC
  Administered 2013-06-24: 5 mg via ORAL
  Filled 2013-06-24: qty 1

## 2013-06-24 MED ORDER — ENOXAPARIN SODIUM 30 MG/0.3ML ~~LOC~~ SOLN
30.0000 mg | Freq: Two times a day (BID) | SUBCUTANEOUS | Status: DC
  Filled 2013-06-24 (×2): qty 0.3

## 2013-06-24 MED ORDER — ENOXAPARIN SODIUM 30 MG/0.3ML ~~LOC~~ SOLN
30.0000 mg | Freq: Two times a day (BID) | SUBCUTANEOUS | Status: DC
  Administered 2013-06-24 – 2013-06-25 (×3): 30 mg via SUBCUTANEOUS
  Filled 2013-06-24 (×6): qty 0.3

## 2013-06-24 NOTE — Discharge Summary (Signed)
  Discharge summary job [id number]

## 2013-06-24 NOTE — Progress Notes (Signed)
[place] PHYSICAL MEDICINE & REHABILITATION     PROGRESS NOTE    Subjective/Complaints: Had a great night. Pleased with therapy.. A 12 point review of systems has been performed and if not noted above is otherwise negative.   Objective: Vital Signs: Blood pressure 137/67, pulse 91, temperature 97.6 F (36.4 C), temperature source Oral, resp. rate 18, height 5\' 8"  (1.727 m), weight 112.311 kg (247 lb 9.6 oz), SpO2 98.00%. No results found. No results found for this basename: WBC, HGB, HCT, PLT,  in the last 72 hours No results found for this basename: NA, K, CL, CO, GLUCOSE, BUN, CREATININE, CALCIUM,  in the last 72 hours CBG (last 3)   Recent Labs  06/23/13 1142 06/23/13 1637 06/23/13 2113  GLUCAP 110* 116* 144*    Wt Readings from Last 3 Encounters:  06/18/13 112.311 kg (247 lb 9.6 oz)  06/11/13 115.667 kg (255 lb)  06/11/13 115.667 kg (255 lb)    Physical Exam:  Constitutional: He is oriented to person, place, and time. He appears well-developed.  HENT: oral mucosa pink and moist. Dentition good  Head: Normocephalic.  Eyes: EOM are normal.  Neck: Normal range of motion. Neck supple. No thyromegaly present.  Cardiovascular: Normal rate and regular rhythm. No murmurs, rubs, or gallops  Respiratory: Effort normal and breath sounds normal. No respiratory distress. No wheezes, rales, or rhonchi  GI: . Bowel sounds are normal. He exhibits mild distension. Non-tender Skin:  Bilateral knee incisions clean and dry and appropriately tender. Mild edema surrounding area left more than right extremities show 1+ pedal edema. Knee ROM  100deg Pulses 2+ Neuro: alert and oriented--cognition normal CN nerves normal. sensation is decreased to light touch and proprioception in both feet  Upper extremity strength is 5/5 bilateral deltoid, bicep, tricep, grip  Lower ext strength is 2+ in the hip flexors 1+ knee extensors, 4+/5 ankle dorsiflexors plantar  flexors   Assessment/Plan: 1. Functional deficits secondary to OA of bilateral knees s/p TKA's which require 3+ hours per day of interdisciplinary therapy in a comprehensive inpatient rehab setting. Physiatrist is providing close team supervision and 24 hour management of active medical problems listed below. Physiatrist and rehab team continue to assess barriers to discharge/monitor patient progress toward functional and medical goals.  Home tomorrow  FIM: FIM - Bathing Bathing Steps Patient Completed: Chest;Right Arm;Left Arm;Abdomen;Front perineal area;Right upper leg;Left upper leg;Right lower leg (including foot);Left lower leg (including foot);Buttocks Bathing: 5: Supervision: Safety issues/verbal cues  FIM - Upper Body Dressing/Undressing Upper body dressing/undressing steps patient completed: Thread/unthread right sleeve of pullover shirt/dresss;Thread/unthread left sleeve of pullover shirt/dress;Put head through opening of pull over shirt/dress;Pull shirt over trunk Upper body dressing/undressing: 5: Set-up assist to: Obtain clothing/put away FIM - Lower Body Dressing/Undressing Lower body dressing/undressing steps patient completed: Thread/unthread right pants leg;Thread/unthread left pants leg;Pull pants up/down Lower body dressing/undressing: 4: Min-Patient completed 75 plus % of tasks  FIM - Toileting Toileting steps completed by patient: Adjust clothing prior to toileting;Performs perineal hygiene;Adjust clothing after toileting Toileting Assistive Devices: Grab bar or rail for support Toileting: 4: Steadying assist  FIM - Diplomatic Services operational officerToilet Transfers Toilet Transfers Assistive Devices: Elevated toilet seat;Grab bars;Walker Toilet Transfers: 4-From toilet/BSC: Min A (steadying Pt. > 75%);4-To toilet/BSC: Min A (steadying Pt. > 75%)  FIM - Bed/Chair Transfer Bed/Chair Transfer Assistive Devices: Therapist, occupationalWalker Bed/Chair Transfer: 5: Supine > Sit: Supervision (verbal cues/safety issues);5:  Sit > Supine: Supervision (verbal cues/safety issues);5: Bed > Chair or W/C: Supervision (verbal cues/safety issues);5: Chair or W/C >  Bed: Supervision (verbal cues/safety issues)  FIM - Locomotion: Wheelchair Distance: 150 Locomotion: Wheelchair: 0: Activity did not occur FIM - Locomotion: Ambulation Locomotion: Ambulation Assistive Devices: Designer, industrial/productWalker - Rolling Ambulation/Gait Assistance: 5: Supervision Locomotion: Ambulation: 2: Travels 50 - 149 ft with supervision/safety issues  Comprehension Comprehension Mode: Auditory Comprehension: 6-Follows complex conversation/direction: With extra time/assistive device  Expression Expression Mode: Verbal Expression: 6-Expresses complex ideas: With extra time/assistive device  Social Interaction Social Interaction: 6-Interacts appropriately with others with medication or extra time (anti-anxiety, antidepressant).  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 DVT Prophylaxis/Anticoagulation: Coumadin for DVT prophylaxis. Continue Lovenox until INR greater than 2.00. Monitor for any bleeding episodes. dopplers neg 3. Pain Management: will schedule robaxin    -oxy stopped due to AMS  -ultram for moderate to severe pain  -regular ice effective 4. Mood/depression: Prozac 20 mg twice a day. Provide emotional support  5. Neuropsych: This patient is capable of making decisions on his own behalf.  6. Acute blood loss anemia. Followup CBC  7. Diabetes mellitus with peripheral neuropathy. Glucophage 1000 mg twice a day, Glucotrol 10 mg twice a day. Check blood sugars a.c. and at bedtime   -reasonable control at present 8. Hyperlipidemia. Zocor  9. Hypertension. Currently on no antihypertensive medication. Patient on lisinopril 20 mg daily prior to admission. Monitor with  increased mobility  10. Sleep apnea. CPAP   -restoril effective 11.constipation. Resume bid colace  -supp's for hemorrhoids 12. GERD: double dose protonix, prn maalox---improved     LOS (Days) 8 A FACE TO FACE EVALUATION WAS PERFORMED  [name] 06/24/2013 7:32 AM

## 2013-06-24 NOTE — Progress Notes (Signed)
Occupational Therapy Session Note  Patient Details  Name: Lowella BandyRichard Podesta MRN: [id number] Date of Birth: 1945/10/30  Today's Date: 06/24/2013  Session 1 Time: 0700-0755 Time Calculation (min): 55 min  Short Term Goals: Week 1:  OT Short Term Goal 1 (Week 1): STG = LTG due to short LOS  Skilled Therapeutic Interventions/Progress Updates:    Pt engaged in bathing at shower level and dressing with sit<>stand from tub bench.  Focus on activity tolerance, safety awareness, functional amb with RW, dynamic standing balance, energy conservation, and problem solving.  Pt requested to use toilet before shower and performed all toileting tasks at mod I level.  Pt completed all tasks at mod I using AE appropriately for LB bathing and dressing tasks.  Therapy Documentation Precautions:  Precautions Precautions: Knee;Fall Required Braces or Orthoses: Knee Immobilizer - Right;Knee Immobilizer - Left Knee Immobilizer - Right: Discontinue once straight leg raise with < 10 degree lag Knee Immobilizer - Left: Discontinue once straight leg raise with < 10 degree lag Restrictions Weight Bearing Restrictions: No Other Position/Activity Restrictions: WBAT Pain: Pain Assessment Pain Assessment: 0-10 Pain Score: 4  Pain Type: Surgical pain Pain Location: Knee Pain Orientation: Right;Left Pain Descriptors / Indicators: Aching;Sore Pain Onset: On-going Pain Intervention(s): RN made aware;Repositioned;Cold applied ADL: ADL ADL Comments: Refer to FIM  See FIM for current functional status  Therapy/Group: Individual Therapy  Session 2 Time: 1115-1200 Pt c/o 4/10 pain in bilateral knees; RN aware, repositioned, and ice pack applied Individual Therapy  Pt amb with RW to therapy gym to engaged in functional amb with RW for dynamic standing tasks and to retrieve items from variety of surface heights.  Pt use AE appropriately to retrieve items on floor or lower surfaces.  Pt exhibited appropriate  weight shift when reaching outside of BOS to retrieve items.  Focus on activity tolerance, functional amb with RW, dynamic standing balance, safety awareness, and discharge planning.  Pt is mod I with RW in room to move around and go to bathroom.   Rich Bravehomas Chappell Gwyn Hieronymus 06/24/2013, 7:57 AM

## 2013-06-24 NOTE — Progress Notes (Signed)
Physical Therapy Discharge Summary  Patient Details  Name: Darryl Chang MRN: [id number] Date of Birth: April 13, 1945  Today's Date: 06/24/2013 Time: 0034-9179 Time Calculation (min): 45 min  Patient has met 9 of 9 long term goals due to improved activity tolerance, improved balance, increased strength, increased range of motion, decreased pain and functional use of  right lower extremity and left lower extremity.  Patient to discharge at an ambulatory level Modified Independent in home environment and supervision in community environment.  Patient's care partner is independent to provide the necessary physical assistance at discharge.  Reasons goals not met: N/A  Recommendation:  Patient will benefit from ongoing skilled PT services in outpatient setting to continue to advance safe functional mobility, address ongoing impairments in decreased functional endurance, decreased balance, decreased strength, decreased ROM, and minimize fall risk.  Equipment: Pt reports that he has a rolling walker at home  Reasons for discharge: treatment goals met and discharge from hospital  Patient/family agrees with progress made and goals achieved: Yes  Skilled Therapeutic Interventions 1:1. Pt received sitting EOB, ready for therapy. Focus this session on functional endurance and B LE strength/ROM. Pt with excellent tolerance to w/c propulsion room>therapy gym and amb w/ RW therapy gym>room at mod(I) level. Verbally reviewed HEP handout, pt verbalized understanding. Use of NuStep to target B LE strength/knee ROM, level 4x10 min, with excellent tolerance. B knee ROM formally reassessed. Pt supine in bed at end of session w/ all needs in reach L LE in CPM. Pt cleared for mod(I) in room.   PT Discharge Precautions/Restrictions Precautions Precautions: Knee;Fall Precaution Comments: Pt demonstrates (+) SLR B, KIs d/c Restrictions Weight Bearing Restrictions: No Vital Signs   Pain Pain Assessment Pain  Assessment: 0-10 Pain Score: 4  Pain Type: Surgical pain Pain Location: Knee Pain Orientation: Right;Left Pain Descriptors / Indicators: Aching Pain Frequency: Intermittent Pain Onset: Gradual Patients Stated Pain Goal: 3 Pain Intervention(s): Medication (See eMAR);Repositioned Multiple Pain Sites: No Vision/Perception     Cognition Overall Cognitive Status: Within Functional Limits for tasks assessed Arousal/Alertness: Awake/alert Orientation Level: Oriented X4 Attention: Selective Selective Attention: Appears intact Memory: Appears intact Awareness: Appears intact Problem Solving: Appears intact Safety/Judgment: Appears intact Sensation Sensation Light Touch: Appears Intact Stereognosis: Appears Intact Hot/Cold: Appears Intact Proprioception: Appears Intact Coordination Gross Motor Movements are Fluid and Coordinated: Yes Fine Motor Movements are Fluid and Coordinated: Yes Motor  Motor Motor: Within Functional Limits  Mobility Bed Mobility Supine to Sit: 6: Modified independent (Device/Increase time) Sit to Supine: 6: Modified independent (Device/Increase time) Transfers Transfers: Yes Sit to Stand: 6: Modified independent (Device/Increase time) Stand to Sit: 6: Modified independent (Device/Increase time) Stand Pivot Transfers: 6: Modified independent (Device/Increase time) Locomotion  Ambulation Ambulation: Yes Ambulation/Gait Assistance: 6: Modified independent (Device/Increase time) Ambulation Distance (Feet): 175 Feet Assistive device: Rolling walker Gait Gait: Yes Gait Pattern: Impaired Gait Pattern: Step-through pattern;Decreased hip/knee flexion - left;Decreased hip/knee flexion - right;Decreased step length - right;Decreased step length - left;Antalgic Gait velocity: decreased Stairs / Additional Locomotion Stairs: Yes Stairs Assistance: 5: Supervision Stairs Assistance Details: Verbal cues for precautions/safety Stair Management Technique: One  rail Right Number of Stairs: 4 Curb: 5: Supervision (with RW) Wheelchair Mobility Wheelchair Mobility: Yes Wheelchair Assistance: 6: Modified independent (Device/Increase time) Environmental health practitioner: Both upper extremities Wheelchair Parts Management: Needs assistance Distance: 200'  Trunk/Postural Assessment  Cervical Assessment Cervical Assessment: Within Functional Limits Thoracic Assessment Thoracic Assessment: Within Functional Limits Lumbar Assessment Lumbar Assessment: Within Functional Limits Postural Control Postural Control: Within Functional  Limits  Balance Balance Balance Assessed: Yes Static Sitting Balance Static Sitting - Balance Support: Feet supported;No upper extremity supported Static Sitting - Level of Assistance: 7: Independent Dynamic Sitting Balance Dynamic Sitting - Balance Support: Bilateral upper extremity supported;Feet supported;Left upper extremity supported;Right upper extremity supported Dynamic Sitting - Level of Assistance: 7: Independent Dynamic Sitting - Balance Activities: Lateral lean/weight shifting;Forward lean/weight shifting;Reaching for Consulting civil engineer Standing - Balance Support: Bilateral upper extremity supported Static Standing - Level of Assistance: 6: Modified independent (Device/Increase time) Dynamic Standing Balance Dynamic Standing - Balance Support: Bilateral upper extremity supported;Left upper extremity supported;Right upper extremity supported Dynamic Standing - Level of Assistance: 6: Modified independent (Device/Increase time) Dynamic Standing - Balance Activities: Lateral lean/weight shifting;Forward lean/weight shifting;Reaching for objects;Reaching across midline Extremity Assessment  RUE Assessment RUE Assessment: Within Functional Limits LUE Assessment LUE Assessment: Within Functional Limits RLE AROM (degrees) RLE Overall AROM Comments: Flex: 95deg; Ext: -10deg RLE Strength RLE Overall  Strength Comments: (+) SLR, pt demonstrates good functional strength LLE AROM (degrees) LLE Overall AROM Comments: Flex: 95deg; Ext: -10deg LLE Strength LLE Overall Strength Comments: (+) SLR, pt demonstrates good functional strength  See FIM for current functional status  Gilmore Laroche 06/24/2013, 1:59 PM

## 2013-06-24 NOTE — Discharge Summary (Signed)
[facility] Journey:  Darryl Chang, Darryl Chang            ACCOUNT NO.:  [id number]  MEDICAL RECORD NO.:  [id number]  LOCATION:  4M03C                        FACILITY:  [facility]  PHYSICIAN:  Ranelle OysterZachary T. Swartz, M.D.DATE OF BIRTH:  [birth year]  DATE OF ADMISSION:  06/13/2013 DATE OF DISCHARGE:  06/25/2013                              DISCHARGE SUMMARY   DISCHARGE DIAGNOSES: 1. Bilateral total knee arthroplasty secondary to end-stage     osteoarthritis, on June 11, 2013. 2. Coumadin for deep venous thrombosis prophylaxis. 3. Pain management. 4. History of depression. 5. Acute blood loss anemia. 6. Diabetes mellitus with peripheral neuropathy. 7. Hyperlipidemia. 8. Hypertension. 9. Sleep apnea. 10.Constipation-resolved. 11.Gastroesophageal reflux disease.  HISTORY OF PRESENT ILLNESS:  This is a 68 year old right-handed male admitted on June 11, 2013, with progressive bilateral knee pain, left greater than right.  No relief with conservative care.  Underwent bilateral total knee arthroplasties 06/11/2013, Dr. Lequita HaltAluisio. Postoperative pain management with epidural removed on June 13, 2013. Coumadin for DVT prophylaxis.  Weightbearing as tolerated.  Acute blood loss anemia 7.9 and transfused.  Bouts of constipation.  KUB completed on June 16, 2013 negative.  Bouts of confusion felt to be induced by narcotics and monitored.  Physical and occupational therapy ongoing. The patient was admitted for comprehensive rehab program.  PAST MEDICAL HISTORY:  See discharge diagnoses.  SOCIAL HISTORY:  Lives with spouse.  FUNCTIONAL HISTORY PRIOR TO ADMISSION:  Independent using a cane. Functional status upon admission to Rehab Services was ambulating 28 feet with decreased gait velocity requiring cues for posture as well as sequencing.  PHYSICAL EXAMINATION:  VITAL SIGNS:  Blood pressure 159/71, pulse 83, temperature 98.4, respirations 16. GENERAL:  This is an alert male.  He was oriented to person, place, and time.   He was well developed.  He needed some cues for hospital situation and dates. HEENT:  Pupils round and reactive to light. LUNGS:  Clear to auscultation. CARDIAC:  Regular rate and rhythm. ABDOMEN:  Soft, nontender.  Good bowel sounds. EXTREMITIES:  Bilateral knee incisions, and clean and dry, appropriately tender.  REHABILITATION HOSPITAL COURSE:  The patient was admitted to Inpatient Rehab Services with therapies initiated on a 3-hour daily basis consisting of physical therapy, occupational therapy, and rehabilitation nursing.  The following issues were addressed during the patient's rehabilitation stay.  Pertaining to Darryl Chang's bilateral total knee arthroplasties secondary to end-stage osteoarthritis.  Patient would follow up Dr. Ollen GrossFrank [name], Orthopedic Services.  He was weightbearing as tolerated.  Neurovascular sensation intact.  He remained on Coumadin for DVT prophylaxis.  No bleeding episodes.  Subcutaneous Lovenox to INR greater than 2.00.  Venous Doppler studies prior to discharge were negative.  The patient would remain on Coumadin until Jul 11, 2013. Arrangements were made for patient to follow up with primary care provider, Dr. Salvatore DecentPaul Settle, [phone] in relation to following Coumadin therapy which to be completed Jul 11, 2013, and then  resume aspirin therapy as prior to admission.  Pain management issues in regard to oxycodone were discontinued due to some altered mental status, this was discussed with patient and family.  He was changed to Ultram with good pain control as well as mental status continuing to improve.  He was using Robaxin on a limited basis for muscle spasms.  He remained on Prozac for history of depression with emotional support provided.  Acute blood loss anemia with latest hemoglobin of 9.8 and no bleeding episodes.  His blood pressures remained well controlled.  He did have a history of diabetes mellitus with peripheral neuropathy.  He  continued on oral agents as well as diet.  Issues in regard to constipation resolved with laxative assistance.  The patient received weekly collaborative interdisciplinary team conferences to discuss estimated length of stay, family teaching, and any barriers to discharge.  He was ambulating 150 feet rolling walker, supervision, minimal cues at times for increased bilateral hip, knee flexion to encourage more normalized gait pattern.  He was using CPM machines to help promote better range of motion to his knees.  His endurance and strength continued to improve throughout his hospital course.  He remained weightbearing as tolerated. Neurovascular sensation intact.  He could ambulate with a rolling walker from bed to bathroom to complete toileting tasks prior to bathing. Focus was made on activity tolerance, bed mobility, dynamic and static standing balance with activities of daily living.  Full family teaching was completed.  Plan will be discharge to home.  Family, patient request for outpatient therapies.  DISCHARGE MEDICATIONS:  Included Prozac 20 mg p.o. b.i.d., glipizide 10 mg p.o. b.i.d., Glucophage 1000 mg p.o. b.i.d., Robaxin 500 mg p.o. every 6 hours as needed muscle spasms, Protonix 40 mg p.o. b.i.d., Zocor 20 mg p.o. daily, Restoril 15 mg p.o. at bedtime, Ultram 50 mg p.o. every 6 as needed moderate pain, dispensed 90 tablets, Coumadin latest dose of 3 mg adjusted accordingly for an INR of 2.00-3.00.  His diet was a diabetic diet.  SPECIAL INSTRUCTIONS:  The patient will follow up with Dr. Faith RogueZachary Swartz, the Outpatient Rehab Service office as needed; Dr. Ollen GrossFrank [name], Orthopedic Service in 2 weeks, call for appointment.  Office followup with Dr. Salvatore DecentPaul Settle, on Monday, June 30, 2013, [phone], fax number [phone].  While patient remains on Coumadin to be completed on Jul 11, 2013, for DVT prophylaxis.  Patient may resume aspirin therapy 81 mg daily after Coumadin  completed.     Mariam Dollaraniel Kimara Bencomo, P.A.   ______________________________ Ranelle OysterZachary T. Swartz, M.D.    DA/MEDQ  D:  06/24/2013  T:  06/24/2013  Job:  [id number]  cc:   Ollen GrossFrank [name], M.D. Salvatore DecentPaul Settle, MD

## 2013-06-24 NOTE — Discharge Instructions (Signed)
Inpatient Rehab Discharge Instructions  Lowella BandyRichard Perra Discharge date and time: No discharge date for patient encounter.   Activities/Precautions/ Functional Status: Activity: activity as tolerated Diet: diabetic diet Wound Care: keep wound clean and dry Functional status:  ___ No restrictions     ___ Walk up steps independently ___ 24/7 supervision/assistance   ___ Walk up steps with assistance ___ Intermittent supervision/assistance  ___ Bathe/dress independently _x__ Walk with walker     ___ Bathe/dress with assistance ___ Walk Independently    ___ Shower independently ___ Walk with assistance    ___ Shower with assistance ___ No alcohol     ___ Return to work/school ________   COMMUNITY REFERRALS UPON DISCHARGE:    Outpatient: PT                   Agency: [facility] Phone:  [phone]               Appointment Date/Time:  4/23 @ 3:00 pm     Special Instructions: Office followup with Dr. Salvatore DecentPaul Settle Monday, 06/30/2013 [phone] fax number [phone] with Coumadin to be completed 07/11/2013 for DVT prophylaxis   Resume aspirin 81 mg daily after Coumadin completed   My questions have been answered and I understand these instructions. I will adhere to these goals and the provided educational materials after my discharge from the hospital.  Patient/Caregiver Signature _______________________________ Date __________  Clinician Signature _______________________________________ Date __________  Please bring this form and your medication list with you to all your follow-up doctor's appointments.

## 2013-06-24 NOTE — Progress Notes (Signed)
Occupational Therapy Discharge Summary  Patient Details  Name: Mandel Seiden MRN: [id number] Date of Birth: Jul 01, 1945  Today's Date: 06/24/2013  Patient has met 6 of 6 long term goals due to improved activity tolerance, improved balance and ability to compensate for deficits.  Pt made excellent progress with BADLs during this admission.  Pt uses AE appropriately to assist with LB bathing and dressing tasks.  Pt is mod I for bathing, dressing, toilet transfers, and toileting. Pt's wife has been present to observe therapy and has a good understanding of patient's functional level. Patient to discharge at overall Modified Independent level.  Patient's care partner is independent to provide the necessary physical assistance at discharge.     Recommendation:  No further OT services recommended at this time.   Equipment: No equipment providedPt stated he will purchase a shower seat after discharge.  Reasons for discharge: treatment goals met  Patient/family agrees with progress made and goals achieved: Yes  OT Discharge ADL ADL ADL Comments: Refer to FIM Vision/Perception  Vision- History Baseline Vision/History: Wears glasses Wears Glasses: At all times Patient Visual Report: No change from baseline Vision- Assessment Vision Assessment?: No apparent visual deficits  Cognition Overall Cognitive Status: Within Functional Limits for tasks assessed Arousal/Alertness: Awake/alert Orientation Level: Oriented X4 Attention: Selective Sustained Attention: Appears intact Selective Attention: Appears intact Memory: Appears intact Awareness: Appears intact Problem Solving: Appears intact Safety/Judgment: Appears intact Sensation Sensation Light Touch: Appears Intact Stereognosis: Appears Intact Hot/Cold: Appears Intact Proprioception: Appears Intact Motor  Motor Motor: Within Functional Limits Trunk/Postural Assessment  Cervical Assessment Cervical Assessment: Within Functional  Limits Thoracic Assessment Thoracic Assessment: Within Functional Limits Lumbar Assessment Lumbar Assessment: Within Functional Limits Postural Control Postural Control: Within Functional Limits  Balance Static Sitting Balance Static Sitting - Balance Support: Feet supported;Bilateral upper extremity supported Static Sitting - Level of Assistance: 7: Independent Dynamic Sitting Balance Dynamic Sitting - Balance Support: Bilateral upper extremity supported;Feet supported Dynamic Sitting - Level of Assistance: 7: Independent Extremity/Trunk Assessment RUE Assessment RUE Assessment: Within Functional Limits LUE Assessment LUE Assessment: Within Functional Limits  See FIM for current functional status  Leroy Libman 06/24/2013, 6:51 AM

## 2013-06-24 NOTE — Progress Notes (Signed)
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  No bleeding noted.     INR goal 2-3  Plan:  Warfarin 5 mg PO today x1 Resume Lovenox 30mg  SQ q12h for DVT prophylaxis (per Dr. Rosalyn ChartersSwartz's note today to continue lovenox until INR >2).  INR daily  Darryl Chang, RPh Clinical Pharmacist Pager: [phone] 06/24/2013 , 11:57 AM

## 2013-06-24 NOTE — Progress Notes (Signed)
Physical Therapy Session Note  Patient Details  Name: Darryl Chang MRN: [id number] Date of Birth: [birth year]  Today's Date: 06/24/2013 Time: 1000-1045 Time Calculation (min): 45 min  Short Term Goals: Week 1:  PT Short Term Goal 1 (Week 1): STGs=LTGs due to anticipated LOS  Skilled Therapeutic Interventions/Progress Updates:  1:1. Pt received supine in bed, ready for therapy. Focus this session on ambulation, stair negotiation, bed mobility and car transfers. Pt w/ overall good tolerance to amb 150'x2 and 200' w/ RW at mod(I)-(S) level, intermittent cueing for more erect posture. Pt able to demonstrate safe curb step negoiation x2 w/ use of RW and close(S). Pt practiced t/f sup<>sit in elevated tx mat to mimic height of standard bed at home, mod(I). Discussion regarding appropriate placement for pillows in sidelying to increase comfort as well as goals/benefits of OP PT. Pt also able to demonstrate safe car transfer at supervision level. Pt sitting in w/c at end of session w/ all needs in reach.   Therapy Documentation Precautions:  Precautions Precautions: Knee;Fall Required Braces or Orthoses: Knee Immobilizer - Right;Knee Immobilizer - Left Knee Immobilizer - Right: Discontinue once straight leg raise with < 10 degree lag Knee Immobilizer - Left: Discontinue once straight leg raise with < 10 degree lag Restrictions Weight Bearing Restrictions: No Other Position/Activity Restrictions: WBAT  See FIM for current functional status  Therapy/Group: Individual Therapy  Denzil HughesCaroline S Dayjah Selman 06/24/2013, 10:47 AM

## 2013-06-25 DIAGNOSIS — M1710 Unilateral primary osteoarthritis, unspecified knee: Secondary | ICD-10-CM

## 2013-06-25 DIAGNOSIS — Z96659 Presence of unspecified artificial knee joint: Secondary | ICD-10-CM

## 2013-06-25 DIAGNOSIS — IMO0002 Reserved for concepts with insufficient information to code with codable children: Secondary | ICD-10-CM

## 2013-06-25 LAB — GLUCOSE, CAPILLARY: Glucose-Capillary: 136 mg/dL — ABNORMAL HIGH (ref 70–99)

## 2013-06-25 LAB — PROTIME-INR
INR: 1.78 — ABNORMAL HIGH (ref 0.00–1.49)
PROTHROMBIN TIME: 20.2 s — AB (ref 11.6–15.2)

## 2013-06-25 MED ORDER — FLUOXETINE HCL 20 MG PO TABS: 20.0000 mg | ORAL_TABLET | Freq: Two times a day (BID) | ORAL | Status: AC

## 2013-06-25 MED ORDER — WARFARIN SODIUM 5 MG PO TABS: ORAL_TABLET | ORAL | Status: AC

## 2013-06-25 MED ORDER — OMEPRAZOLE 20 MG PO CPDR: 20.0000 mg | DELAYED_RELEASE_CAPSULE | Freq: Two times a day (BID) | ORAL | Status: AC

## 2013-06-25 MED ORDER — TRAMADOL HCL 50 MG PO TABS: 50.0000 mg | ORAL_TABLET | Freq: Four times a day (QID) | ORAL | Status: AC | PRN

## 2013-06-25 MED ORDER — METFORMIN HCL 1000 MG PO TABS: 1000.0000 mg | ORAL_TABLET | Freq: Two times a day (BID) | ORAL | Status: AC

## 2013-06-25 MED ORDER — VITAMIN D 1000 UNITS PO TABS: 5000.0000 [IU] | ORAL_TABLET | Freq: Every morning | ORAL | Status: AC

## 2013-06-25 MED ORDER — METHOCARBAMOL 500 MG PO TABS: 500.0000 mg | ORAL_TABLET | Freq: Four times a day (QID) | ORAL | Status: AC | PRN

## 2013-06-25 MED ORDER — SIMVASTATIN 20 MG PO TABS: 20.0000 mg | ORAL_TABLET | Freq: Every day | ORAL | Status: AC

## 2013-06-25 MED ORDER — WARFARIN SODIUM 5 MG PO TABS
5.0000 mg | ORAL_TABLET | Freq: Every day | ORAL | Status: DC
  Filled 2013-06-25: qty 1

## 2013-06-25 MED ORDER — GLIPIZIDE 5 MG PO TABS: 10.0000 mg | ORAL_TABLET | Freq: Two times a day (BID) | ORAL | Status: AC

## 2013-06-25 NOTE — Progress Notes (Signed)
[facility]     PROGRESS NOTE    Subjective/Complaints: No new issues. Knees at 120% A 12 point review of systems has been performed and if not noted above is otherwise negative.   Objective: Vital Signs: Blood pressure 172/83, pulse 90, temperature 98.4 F (36.9 C), temperature source Oral, resp. rate 20, height 5\' 8"  (1.727 m), weight 108.863 kg (240 lb), SpO2 95.00%. No results found. No results found for this basename: WBC, HGB, HCT, PLT,  in the last 72 hours No results found for this basename: NA, K, CL, CO, GLUCOSE, BUN, CREATININE, CALCIUM,  in the last 72 hours CBG (last 3)   Recent Labs  06/24/13 1640 06/24/13 2123 06/25/13 0715  GLUCAP 114* 98 136*    Wt Readings from Last 3 Encounters:  06/25/13 108.863 kg (240 lb)  06/11/13 115.667 kg (255 lb)  06/11/13 115.667 kg (255 lb)    Physical Exam:  Constitutional: He is oriented to person, place, and time. He appears well-developed.  HENT: oral mucosa pink and moist. Dentition good  Head: Normocephalic.  Eyes: EOM are normal.  Neck: Normal range of motion. Neck supple. No thyromegaly present.  Cardiovascular: Normal rate and regular rhythm. No murmurs, rubs, or gallops  Respiratory: Effort normal and breath sounds normal. No respiratory distress. No wheezes, rales, or rhonchi  GI: . Bowel sounds are normal. He exhibits mild distension. Non-tender Skin:  Bilateral knee incisions clean and dry and appropriately tender. Mild edema surrounding area left more than right extremities show 1+ pedal edema. Knee ROM  100deg Pulses 2+ Neuro: alert and oriented--cognition normal CN nerves normal. sensation is decreased to light touch and proprioception in both feet  Upper extremity strength is 5/5 bilateral deltoid, bicep, tricep, grip  Lower ext strength is 2+ in the hip flexors 1+ knee extensors, 4+/5 ankle dorsiflexors plantar flexors   Assessment/Plan: 1. Functional deficits  secondary to OA of bilateral knees s/p TKA's which require 3+ hours per day of interdisciplinary therapy in a comprehensive inpatient rehab setting. Physiatrist is providing close team supervision and 24 hour management of active medical problems listed below. Physiatrist and rehab team continue to assess barriers to discharge/monitor patient progress toward functional and medical goals.  Home today. Outpt therapies arranged. F/u with ortho  FIM: FIM - Bathing Bathing Steps Patient Completed: Chest;Right Arm;Left Arm;Abdomen;Front perineal area;Buttocks;Left lower leg (including foot);Right lower leg (including foot);Left upper leg;Right upper leg Bathing: 6: Assistive device (Comment)  FIM - Upper Body Dressing/Undressing Upper body dressing/undressing steps patient completed: Thread/unthread right sleeve of pullover shirt/dresss;Thread/unthread left sleeve of pullover shirt/dress;Put head through opening of pull over shirt/dress;Pull shirt over trunk Upper body dressing/undressing: 7: Complete Independence: No helper FIM - Lower Body Dressing/Undressing Lower body dressing/undressing steps patient completed: Thread/unthread right underwear leg;Thread/unthread left underwear leg;Pull underwear up/down;Thread/unthread right pants leg;Thread/unthread left pants leg;Pull pants up/down;Don/Doff left sock;Don/Doff right sock;Don/Doff right shoe;Don/Doff left shoe Lower body dressing/undressing: 6: Assistive device (Comment)  FIM - Toileting Toileting steps completed by patient: Adjust clothing prior to toileting;Performs perineal hygiene;Adjust clothing after toileting Toileting Assistive Devices: Grab bar or rail for support Toileting: 6: Assistive device: No helper  FIM - Diplomatic Services operational officerToilet Transfers Toilet Transfers Assistive Devices: Elevated toilet seat;Grab bars;Walker Toilet Transfers: 6-Assistive device: No helper;6-To toilet/ BSC;6-From toilet/BSC  FIM - Landscape architectBed/Chair Transfer Bed/Chair Transfer  Assistive Devices: Therapist, occupationalWalker Bed/Chair Transfer: 6: Supine > Sit: No assist;6: Sit > Supine: No assist;6: Bed > Chair or W/C: No assist;6: Chair or W/C > Bed:  No assist  FIM - Locomotion: Wheelchair Distance: 200' Locomotion: Wheelchair: 6: Travels 150 ft or more, turns around, maneuvers to table, bed or toilet, negotiates 3% grade: maneuvers on rugs and over door sills independently FIM - Locomotion: Ambulation Locomotion: Ambulation Assistive Devices: Designer, industrial/productWalker - Rolling Ambulation/Gait Assistance: 6: Modified independent (Device/Increase time) Locomotion: Ambulation: 6: Travels 150 ft or more with assistive device/no helper  Comprehension Comprehension Mode: Auditory Comprehension: 6-Follows complex conversation/direction: With extra time/assistive device  Expression Expression Mode: Verbal Expression: 6-Expresses complex ideas: With extra time/assistive device  Social Interaction Social Interaction: 6-Interacts appropriately with others with medication or extra time (anti-anxiety, antidepressant).  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 DVT Prophylaxis/Anticoagulation: Coumadin for DVT prophylaxis.   -dopplers neg 3. Pain Management: will schedule robaxin    -oxy stopped due to AMS  -ultram for moderate to severe pain  -regular ice effective 4. Mood/depression: Prozac 20 mg twice a day. Provide emotional support  5. Neuropsych: This patient is capable of making decisions on his own behalf.  6. Acute blood loss anemia. Followup CBC  7. Diabetes mellitus with peripheral neuropathy. Glucophage 1000 mg twice a day, Glucotrol 10 mg twice a day. Check blood sugars a.c. and at bedtime   -reasonable control at present 8. Hyperlipidemia. Zocor  9. Hypertension. Currently on no antihypertensive  medication. Patient on lisinopril 20 mg daily prior to admission. Monitor with increased mobility  10. Sleep apnea. CPAP   -restoril effective 11.constipation. Hold softeners  -supp's for hemorrhoids 12. GERD: double dose protonix, prn maalox---improved     LOS (Days) 9 A FACE TO FACE EVALUATION WAS PERFORMED  Ranelle OysterZachary T Kathaleen Dudziak 06/25/2013 7:41 AM

## 2013-06-25 NOTE — Patient Care Conference (Signed)
Inpatient RehabilitationTeam Conference and Plan of Care Update Date: 06/24/2013   Time: 3:00 PM    Patient Name: Darryl BandyRichard Lasser      Medical Record Number: [id number]  Date of Birth: 1945-12-30 Sex: Male         Room/Bed: 4M03C/4M03C-01 Payor Info: Payor: MEDICARE / Plan: MEDICARE PART A AND B / Product Type: *No Product type* /    Admitting Diagnosis: B TKR  Admit Date/Time:  06/16/2013  6:40 PM Admission Comments: No comment available   Primary Diagnosis:  <principal problem not specified> Principal Problem: <principal problem not specified>  Patient Active Problem List   Diagnosis Date Noted  . Status post total bilateral knee replacement 06/16/2013  . Postoperative anemia due to acute blood loss 06/13/2013  . OA (osteoarthritis) of knee 06/11/2013    Expected Discharge Date: Expected Discharge Date: 06/25/13  Team Members Present: Physician leading conference: Dr. Faith RogueZachary Swartz Social Worker Present: Amada JupiterLucy Tashanti Dalporto, LCSW Nurse Present: Carlean PurlMaryann Barbour, RN PT Present: Cyndia SkeetersBridgett Ripa, Scot JunPT;Caroline King, PT OT Present: Ardis Rowanom Lanier, COTA;Jennifer Katrinka BlazingSmith, OT SLP Present: Feliberto Gottronourtney Payne, SLP PPS Coordinator present : Tora DuckMarie Noel, RN, CRRN;Becky Henrene DodgeWindsor, PT     Current Status/Progress Goal Weekly Team Focus  Medical   PAIN IMPROVING. READY FOR DC/ KNEE ROM 110 DEGREES  SEE PRIOR  FINALIZING DC PLANNING   Bowel/Bladder   Cont. of bladder and incont. of bowel at times  To be continnet of bladder and bowel  Monitor use of stool softners to reduce incontinence    Swallow/Nutrition/ Hydration             ADL's   supervision/mod I overall  Mod I for basic self care  pt education, safety awareness, discharge planning   Mobility   At target level  Mod(I)-Supervision  funcitonal endurance, B LE strength/knee ROM, ambulation, stair negotiation, d/c planning   Communication             Safety/Cognition/ Behavioral Observations            Pain   C/o pain in bil. knee, sch. robaxin  given, PRN Ultram and tylenol given, ice pack applied  <3  Assess for and treat pain with sch. meds and prn pain meds as ordered   Skin   bil. knee incisions, Steri strips intact on L knee  No skin breakdown   Assess skin q shift for skin breakdown, encourage pt. to turn while in bed    Rehab Goals Patient on target to meet rehab goals: Yes *See Care Plan and progress notes for long and short-term goals.  Barriers to Discharge: NONE     Possible Resolutions to Barriers:  N/A    Discharge Planning/Teaching Needs:  home with wife who can provide 24/7 assistance      Team Discussion:  Has reached mod i goals and ready for d/c tomorrow  Revisions to Treatment Plan:  None   Continued Need for Acute Rehabilitation Level of Care: The patient requires daily medical management by a physician with specialized training in physical medicine and rehabilitation for the following conditions: Daily direction of a multidisciplinary physical rehabilitation program to ensure safe treatment while eliciting the highest outcome that is of practical value to the patient.: Yes Daily medical management of patient stability for increased activity during participation in an intensive rehabilitation regime.: Yes Daily analysis of laboratory values and/or radiology reports with any subsequent need for medication adjustment of medical intervention for : Post surgical problems;Other  Amada JupiterLucy Kael Keetch 06/25/2013, 11:07 AM

## 2013-06-25 NOTE — Progress Notes (Signed)
Patient and wife given discharge instructions and prescriptions by Deatra Inaan Angiulli, PA; all questions answered.  Patient escorted via wheelchair by Joni ReiningNicole, NT to wife's vehicle.

## 2013-06-25 NOTE — Progress Notes (Signed)
Social Work  Discharge Note  The overall goal for the admission was met for:   Discharge location: Yes - home with family to provide any assist needed  Length of Stay: Yes - 9 days  Discharge activity level: Yes - modified independent  Home/community participation: Yes  Services provided included: MD, RD, PT, OT, RN, TR, Pharmacy and SW  Financial Services: Medicare and Private Insurance: [employer] [place]  Follow-up services arranged: Outpatient: PT via [facility]  Comments (or additional information):  Patient/Family verbalized understanding of follow-up arrangements: Yes  Individual responsible for coordination of the follow-up plan: patient  Confirmed correct DME delivered: NA - had all needed DME    [facility]

## 2013-06-27 ENCOUNTER — Telehealth: Payer: Self-pay

## 2013-06-27 NOTE — Telephone Encounter (Signed)
Valarie with [facility] called to get patients medication list, his height and weight faxed to them.  Fax #[phone]

## 2013-06-27 NOTE — Telephone Encounter (Signed)
Contacted Valerie at [facility].  They have the records they need now so no further action needed.

## 2013-07-02 ENCOUNTER — Telehealth: Payer: Self-pay | Admitting: *Deleted

## 2013-07-02 NOTE — Telephone Encounter (Signed)
Calling to speak to Darryl Chang about getting medication refill. He lives in [place] and doe not have an appt scheduled here.

## 2014-11-20 IMAGING — CR DG ABDOMEN 1V
4 series · 4 of 4 positions shown · non-contrast
Comparison: None.

CLINICAL DATA: Abdominal pain

EXAM:
ABDOMEN - 1 VIEW

[t abdomen supine (1 of 4)]
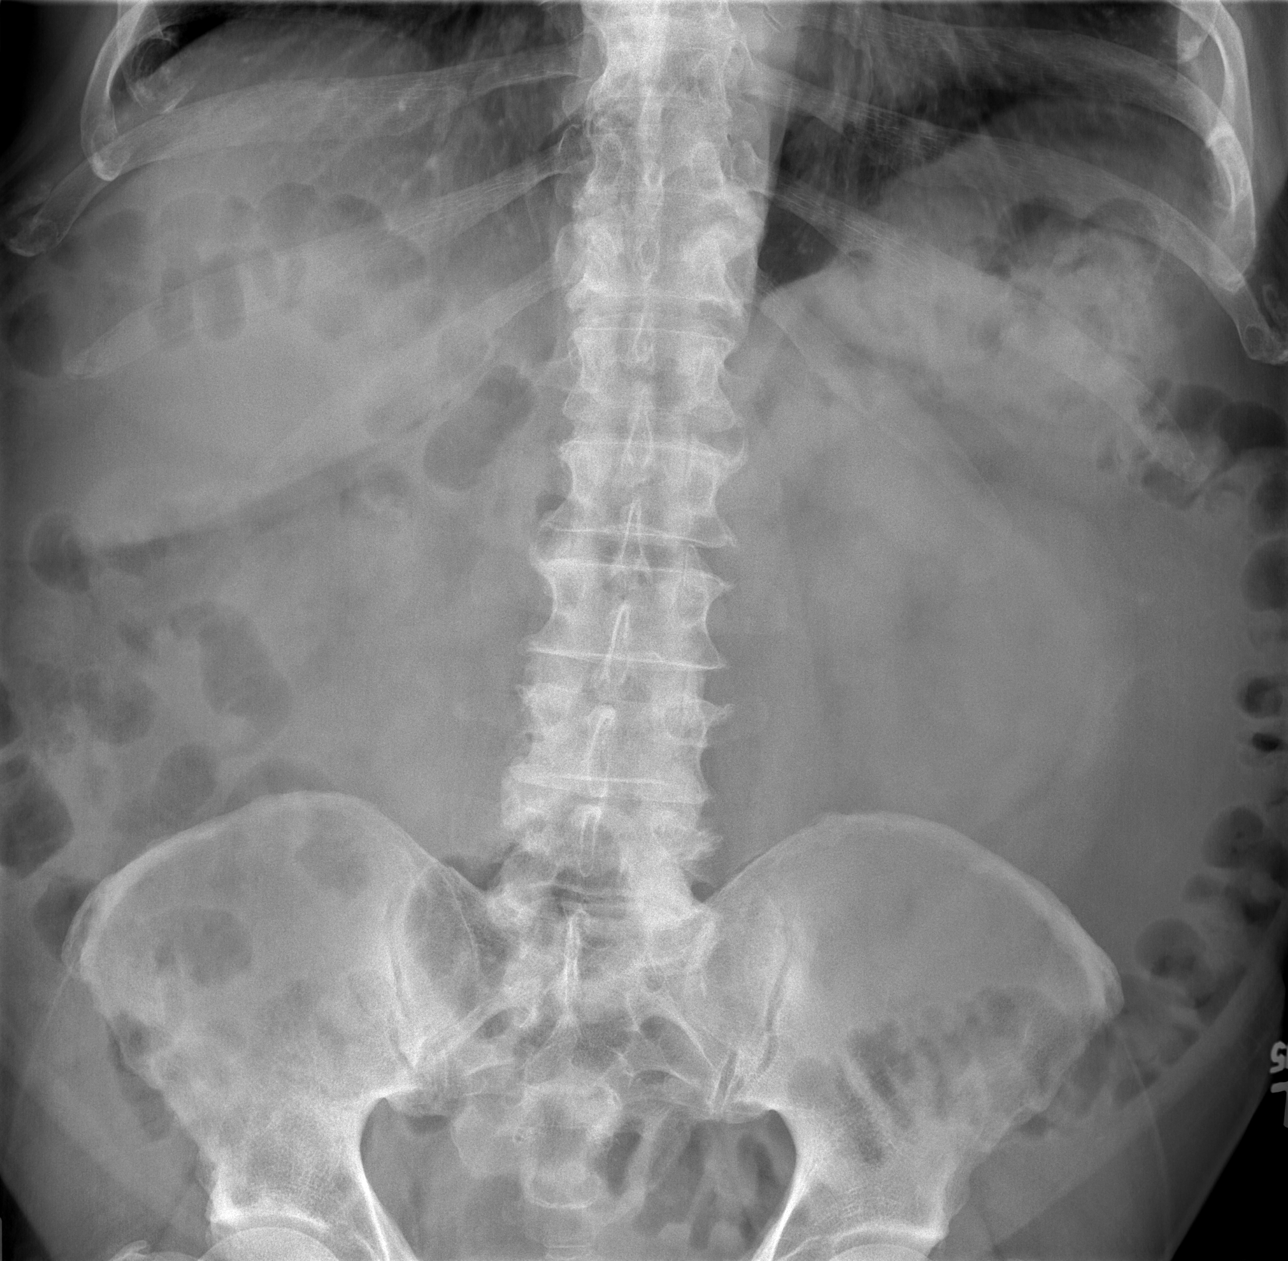

[t abdomen supine (2 of 4)]
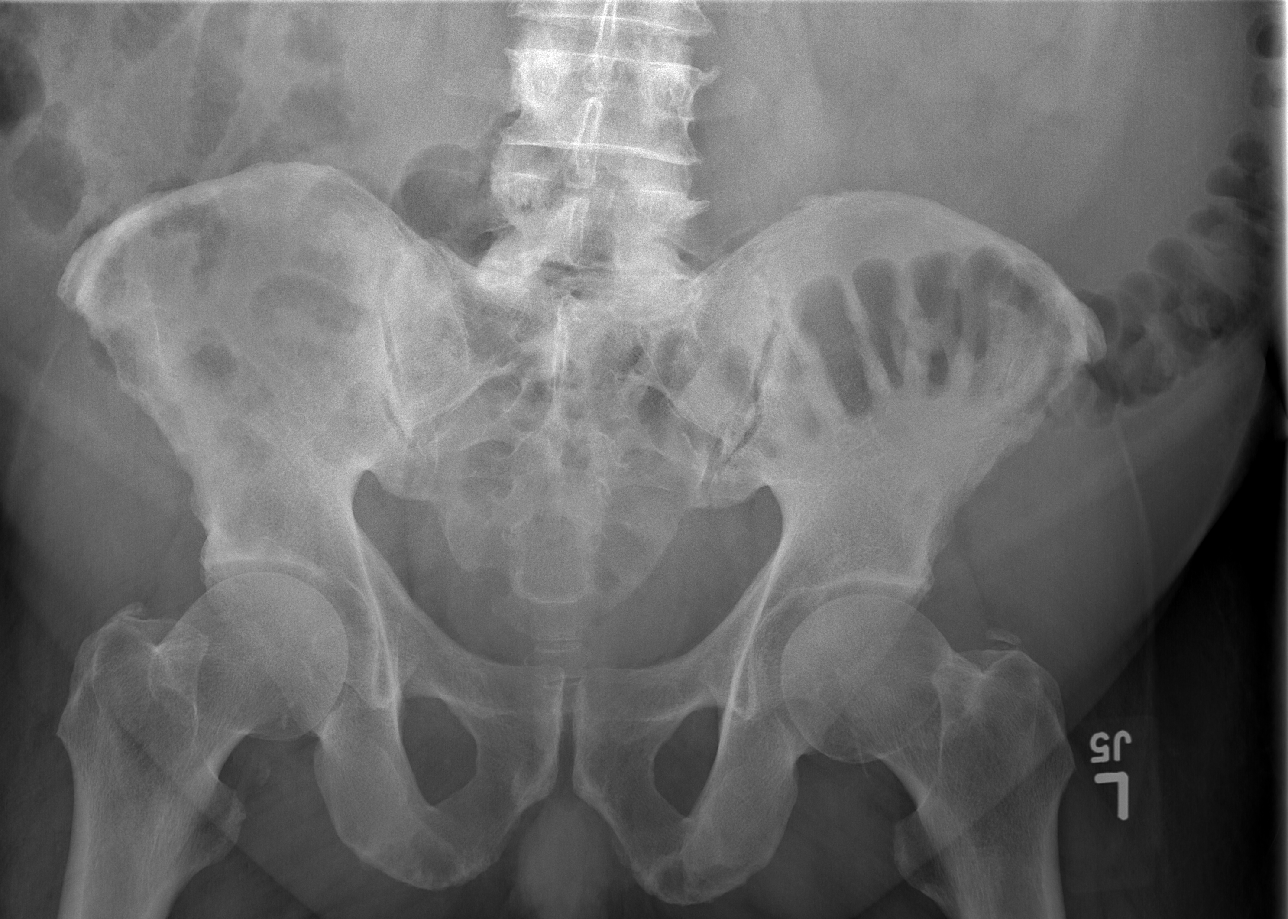

[t abdomen supine (3 of 4)]
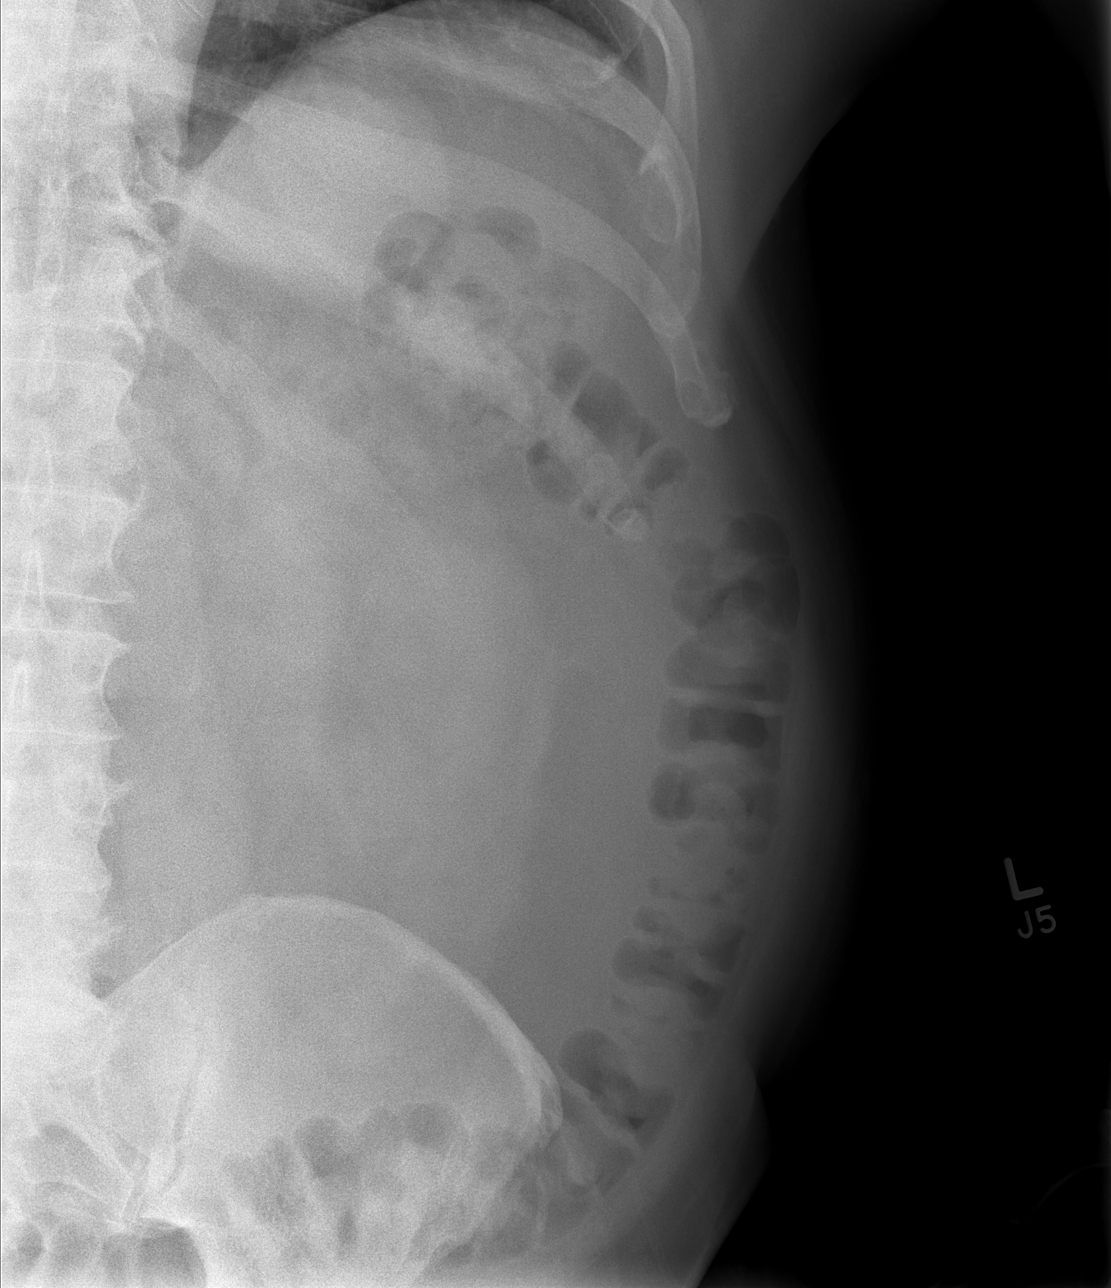

[t abdomen supine (4 of 4)]
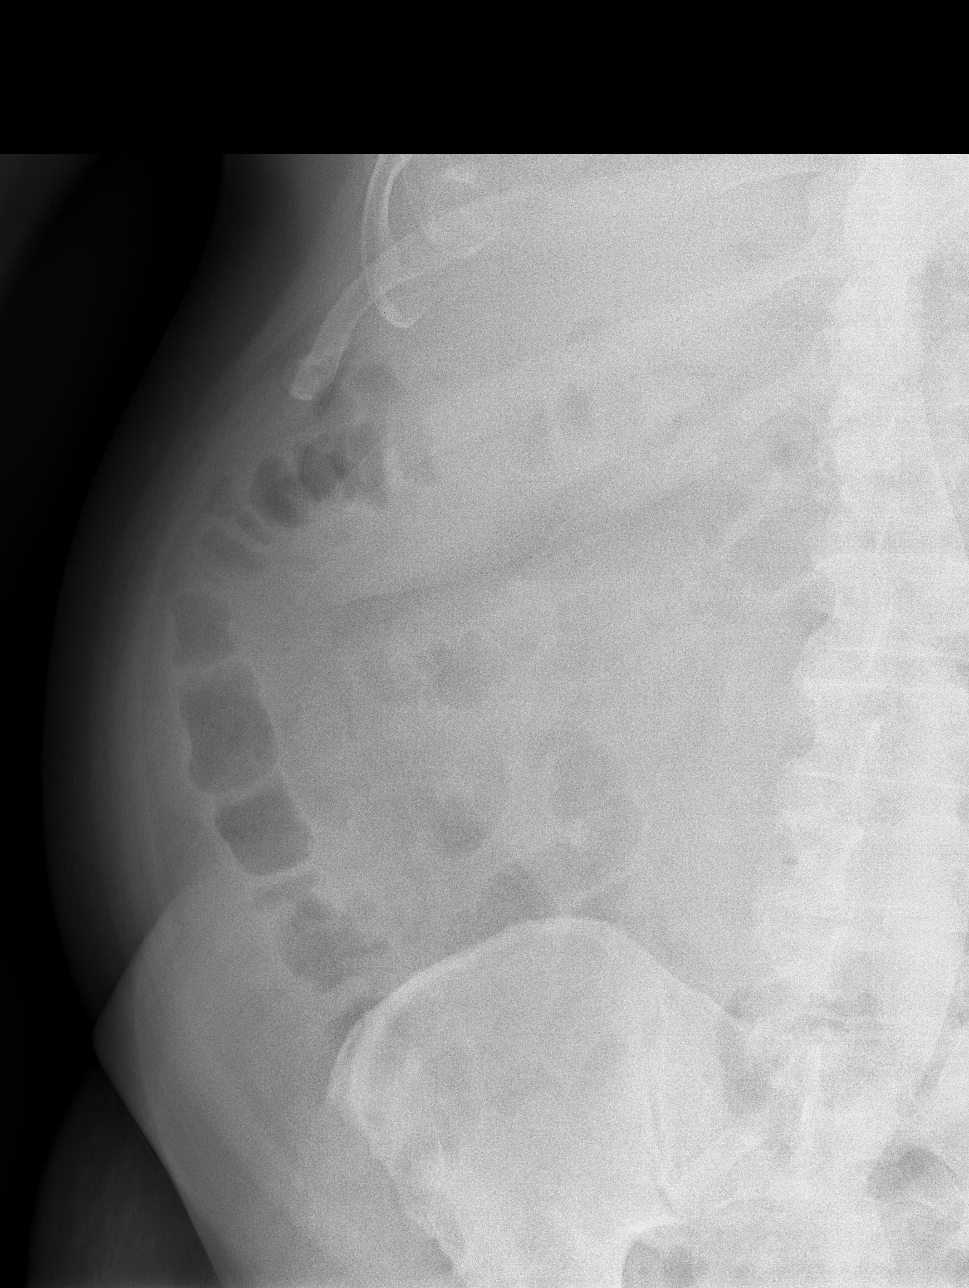

[4 of 4 positions shown; findings below may reference images not displayed]

FINDINGS: Scattered large and small bowel gas is noted. No abnormal mass or
abnormal calcifications are seen. Degenerative change of the lumbar
spine is noted. No free air is seen.
IMPRESSION: No acute abnormality noted.
